# Patient Record
Sex: Female | Born: 1945 | Race: Black or African American | Hispanic: No | Marital: Single | Smoking: Never smoker
Health system: Southern US, Community
[De-identification: ages and names within clinical notes are randomized; demographics above are authoritative.]

## PROBLEM LIST (undated history)

## (undated) DIAGNOSIS — I10 Essential (primary) hypertension: Secondary | ICD-10-CM

## (undated) DIAGNOSIS — F0280 Dementia in other diseases classified elsewhere without behavioral disturbance: Secondary | ICD-10-CM

## (undated) DIAGNOSIS — G309 Alzheimer's disease, unspecified: Secondary | ICD-10-CM

## (undated) DIAGNOSIS — R001 Bradycardia, unspecified: Secondary | ICD-10-CM

## (undated) DIAGNOSIS — E079 Disorder of thyroid, unspecified: Secondary | ICD-10-CM

## (undated) DIAGNOSIS — G43909 Migraine, unspecified, not intractable, without status migrainosus: Secondary | ICD-10-CM

## (undated) HISTORY — PX: BACK SURGERY: SHX140

## (undated) HISTORY — PX: ABDOMINAL HYSTERECTOMY: SHX81

## (undated) HISTORY — PX: OTHER SURGICAL HISTORY: SHX169

## (undated) HISTORY — PX: CARPAL TUNNEL RELEASE: SHX101

---

## 2015-01-25 DIAGNOSIS — I10 Essential (primary) hypertension: Secondary | ICD-10-CM | POA: Diagnosis not present

## 2015-01-25 DIAGNOSIS — G43909 Migraine, unspecified, not intractable, without status migrainosus: Secondary | ICD-10-CM | POA: Diagnosis not present

## 2015-01-25 DIAGNOSIS — M1990 Unspecified osteoarthritis, unspecified site: Secondary | ICD-10-CM | POA: Diagnosis not present

## 2015-01-25 DIAGNOSIS — R739 Hyperglycemia, unspecified: Secondary | ICD-10-CM | POA: Diagnosis not present

## 2015-06-22 DIAGNOSIS — M1990 Unspecified osteoarthritis, unspecified site: Secondary | ICD-10-CM | POA: Diagnosis not present

## 2015-06-22 DIAGNOSIS — G43909 Migraine, unspecified, not intractable, without status migrainosus: Secondary | ICD-10-CM | POA: Diagnosis not present

## 2015-06-22 DIAGNOSIS — Z23 Encounter for immunization: Secondary | ICD-10-CM | POA: Diagnosis not present

## 2015-06-22 DIAGNOSIS — I10 Essential (primary) hypertension: Secondary | ICD-10-CM | POA: Diagnosis not present

## 2015-06-22 DIAGNOSIS — F0390 Unspecified dementia without behavioral disturbance: Secondary | ICD-10-CM | POA: Diagnosis not present

## 2015-06-29 DIAGNOSIS — Z23 Encounter for immunization: Secondary | ICD-10-CM | POA: Diagnosis not present

## 2015-06-29 DIAGNOSIS — M1990 Unspecified osteoarthritis, unspecified site: Secondary | ICD-10-CM | POA: Diagnosis not present

## 2015-06-29 DIAGNOSIS — G43909 Migraine, unspecified, not intractable, without status migrainosus: Secondary | ICD-10-CM | POA: Diagnosis not present

## 2015-06-29 DIAGNOSIS — F0390 Unspecified dementia without behavioral disturbance: Secondary | ICD-10-CM | POA: Diagnosis not present

## 2015-07-19 ENCOUNTER — Other Ambulatory Visit: Payer: Self-pay | Admitting: Neurology

## 2015-07-19 DIAGNOSIS — E559 Vitamin D deficiency, unspecified: Secondary | ICD-10-CM | POA: Diagnosis not present

## 2015-07-19 DIAGNOSIS — M818 Other osteoporosis without current pathological fracture: Secondary | ICD-10-CM | POA: Diagnosis not present

## 2015-07-19 DIAGNOSIS — R519 Headache, unspecified: Secondary | ICD-10-CM

## 2015-07-19 DIAGNOSIS — M545 Low back pain: Secondary | ICD-10-CM | POA: Diagnosis not present

## 2015-07-19 DIAGNOSIS — R5383 Other fatigue: Secondary | ICD-10-CM | POA: Diagnosis not present

## 2015-07-19 DIAGNOSIS — E538 Deficiency of other specified B group vitamins: Secondary | ICD-10-CM | POA: Diagnosis not present

## 2015-07-19 DIAGNOSIS — R51 Headache: Principal | ICD-10-CM

## 2015-07-19 DIAGNOSIS — R1084 Generalized abdominal pain: Secondary | ICD-10-CM | POA: Diagnosis not present

## 2015-07-19 DIAGNOSIS — R410 Disorientation, unspecified: Secondary | ICD-10-CM

## 2015-07-19 DIAGNOSIS — R413 Other amnesia: Secondary | ICD-10-CM | POA: Diagnosis not present

## 2015-07-19 DIAGNOSIS — Z79899 Other long term (current) drug therapy: Secondary | ICD-10-CM | POA: Diagnosis not present

## 2015-07-19 DIAGNOSIS — G4452 New daily persistent headache (NDPH): Secondary | ICD-10-CM | POA: Diagnosis not present

## 2015-08-02 ENCOUNTER — Ambulatory Visit (HOSPITAL_COMMUNITY): Payer: Commercial Managed Care - HMO

## 2015-08-25 ENCOUNTER — Ambulatory Visit (HOSPITAL_COMMUNITY)
Admission: RE | Admit: 2015-08-25 | Discharge: 2015-08-25 | Disposition: A | Discharge status: Home / Self Care | Payer: Commercial Managed Care - HMO | Source: Ambulatory Visit | Attending: Neurology | Admitting: Neurology

## 2015-08-25 DIAGNOSIS — R410 Disorientation, unspecified: Secondary | ICD-10-CM

## 2015-08-25 DIAGNOSIS — R9089 Other abnormal findings on diagnostic imaging of central nervous system: Secondary | ICD-10-CM | POA: Insufficient documentation

## 2015-08-25 DIAGNOSIS — R51 Headache: Secondary | ICD-10-CM | POA: Diagnosis not present

## 2015-08-25 DIAGNOSIS — R519 Headache, unspecified: Secondary | ICD-10-CM

## 2015-08-30 DIAGNOSIS — I10 Essential (primary) hypertension: Secondary | ICD-10-CM | POA: Diagnosis not present

## 2015-08-30 DIAGNOSIS — I776 Arteritis, unspecified: Secondary | ICD-10-CM | POA: Diagnosis not present

## 2015-08-30 DIAGNOSIS — R4182 Altered mental status, unspecified: Secondary | ICD-10-CM | POA: Diagnosis not present

## 2015-08-30 DIAGNOSIS — G4459 Other complicated headache syndrome: Secondary | ICD-10-CM | POA: Diagnosis not present

## 2015-08-30 DIAGNOSIS — G4710 Hypersomnia, unspecified: Secondary | ICD-10-CM | POA: Diagnosis not present

## 2015-08-30 DIAGNOSIS — Z79899 Other long term (current) drug therapy: Secondary | ICD-10-CM | POA: Diagnosis not present

## 2015-09-21 DIAGNOSIS — F0390 Unspecified dementia without behavioral disturbance: Secondary | ICD-10-CM | POA: Diagnosis not present

## 2015-09-21 DIAGNOSIS — M1990 Unspecified osteoarthritis, unspecified site: Secondary | ICD-10-CM | POA: Diagnosis not present

## 2015-09-21 DIAGNOSIS — I10 Essential (primary) hypertension: Secondary | ICD-10-CM | POA: Diagnosis not present

## 2015-09-29 DIAGNOSIS — R569 Unspecified convulsions: Secondary | ICD-10-CM | POA: Diagnosis not present

## 2015-10-11 DIAGNOSIS — I10 Essential (primary) hypertension: Secondary | ICD-10-CM | POA: Diagnosis not present

## 2015-10-11 DIAGNOSIS — G43C0 Periodic headache syndromes in child or adult, not intractable: Secondary | ICD-10-CM | POA: Diagnosis not present

## 2015-10-11 DIAGNOSIS — F0150 Vascular dementia without behavioral disturbance: Secondary | ICD-10-CM | POA: Diagnosis not present

## 2015-10-11 DIAGNOSIS — G309 Alzheimer's disease, unspecified: Secondary | ICD-10-CM | POA: Diagnosis not present

## 2015-12-21 DIAGNOSIS — G43909 Migraine, unspecified, not intractable, without status migrainosus: Secondary | ICD-10-CM | POA: Diagnosis not present

## 2015-12-21 DIAGNOSIS — M170 Bilateral primary osteoarthritis of knee: Secondary | ICD-10-CM | POA: Diagnosis not present

## 2015-12-21 DIAGNOSIS — I10 Essential (primary) hypertension: Secondary | ICD-10-CM | POA: Diagnosis not present

## 2015-12-21 DIAGNOSIS — F0280 Dementia in other diseases classified elsewhere without behavioral disturbance: Secondary | ICD-10-CM | POA: Diagnosis not present

## 2016-01-04 DIAGNOSIS — G309 Alzheimer's disease, unspecified: Secondary | ICD-10-CM | POA: Diagnosis not present

## 2016-01-04 DIAGNOSIS — Z79899 Other long term (current) drug therapy: Secondary | ICD-10-CM | POA: Diagnosis not present

## 2016-01-04 DIAGNOSIS — I10 Essential (primary) hypertension: Secondary | ICD-10-CM | POA: Diagnosis not present

## 2016-01-04 DIAGNOSIS — F0150 Vascular dementia without behavioral disturbance: Secondary | ICD-10-CM | POA: Diagnosis not present

## 2016-03-05 DIAGNOSIS — G309 Alzheimer's disease, unspecified: Secondary | ICD-10-CM | POA: Diagnosis not present

## 2016-03-05 DIAGNOSIS — I10 Essential (primary) hypertension: Secondary | ICD-10-CM | POA: Diagnosis not present

## 2016-03-05 DIAGNOSIS — F0150 Vascular dementia without behavioral disturbance: Secondary | ICD-10-CM | POA: Diagnosis not present

## 2016-03-05 DIAGNOSIS — G43C0 Periodic headache syndromes in child or adult, not intractable: Secondary | ICD-10-CM | POA: Diagnosis not present

## 2016-04-01 ENCOUNTER — Encounter (HOSPITAL_COMMUNITY): Payer: Self-pay | Admitting: *Deleted

## 2016-04-01 ENCOUNTER — Emergency Department (HOSPITAL_COMMUNITY): Payer: Commercial Managed Care - HMO

## 2016-04-01 ENCOUNTER — Emergency Department (HOSPITAL_COMMUNITY)
Admission: EM | Admit: 2016-04-01 | Discharge: 2016-04-03 | Disposition: A | Discharge status: Home / Self Care | Payer: Commercial Managed Care - HMO | Attending: Emergency Medicine | Admitting: Emergency Medicine

## 2016-04-01 DIAGNOSIS — R443 Hallucinations, unspecified: Secondary | ICD-10-CM | POA: Diagnosis not present

## 2016-04-01 DIAGNOSIS — G309 Alzheimer's disease, unspecified: Secondary | ICD-10-CM | POA: Diagnosis not present

## 2016-04-01 DIAGNOSIS — Z79899 Other long term (current) drug therapy: Secondary | ICD-10-CM | POA: Insufficient documentation

## 2016-04-01 DIAGNOSIS — R45851 Suicidal ideations: Secondary | ICD-10-CM | POA: Insufficient documentation

## 2016-04-01 DIAGNOSIS — Z046 Encounter for general psychiatric examination, requested by authority: Secondary | ICD-10-CM | POA: Diagnosis present

## 2016-04-01 DIAGNOSIS — R4182 Altered mental status, unspecified: Secondary | ICD-10-CM | POA: Diagnosis not present

## 2016-04-01 DIAGNOSIS — I10 Essential (primary) hypertension: Secondary | ICD-10-CM | POA: Insufficient documentation

## 2016-04-01 HISTORY — DX: Dementia in other diseases classified elsewhere, unspecified severity, without behavioral disturbance, psychotic disturbance, mood disturbance, and anxiety: F02.80

## 2016-04-01 HISTORY — DX: Alzheimer's disease, unspecified: G30.9

## 2016-04-01 HISTORY — DX: Essential (primary) hypertension: I10

## 2016-04-01 HISTORY — DX: Disorder of thyroid, unspecified: E07.9

## 2016-04-01 HISTORY — DX: Migraine, unspecified, not intractable, without status migrainosus: G43.909

## 2016-04-01 MED ORDER — AMLODIPINE BESY-BENAZEPRIL HCL 5-20 MG PO CAPS: 1.0000 | ORAL_CAPSULE | Freq: Every day | ORAL | Status: DC

## 2016-04-01 MED ORDER — MEMANTINE HCL 10 MG PO TABS
ORAL_TABLET | ORAL | Status: AC
Start: 2016-04-01 — End: 2016-04-01
  Filled 2016-04-01: qty 1

## 2016-04-01 MED ORDER — MEMANTINE HCL 5 MG PO TABS
5.0000 mg | ORAL_TABLET | Freq: Two times a day (BID) | ORAL | Status: DC
  Administered 2016-04-02 – 2016-04-03 (×4): 5 mg via ORAL
  Filled 2016-04-01 (×6): qty 1

## 2016-04-01 MED ORDER — TOPIRAMATE 100 MG PO TABS
100.0000 mg | ORAL_TABLET | Freq: Every day | ORAL | Status: DC
  Administered 2016-04-02 – 2016-04-03 (×2): 100 mg via ORAL
  Filled 2016-04-01 (×3): qty 1

## 2016-04-01 MED ORDER — DONEPEZIL HCL 5 MG PO TABS
10.0000 mg | ORAL_TABLET | Freq: Every day | ORAL | Status: DC
  Administered 2016-04-02 – 2016-04-03 (×2): 10 mg via ORAL
  Filled 2016-04-01 (×2): qty 2
  Filled 2016-04-01: qty 1

## 2016-04-01 NOTE — ED Notes (Addendum)
Pt thinks that "people" are coming at her and looking at her stuff. Daughter states that the people are people in "framed pictures" in her home. Pt stating she has had to place all of her stuff in her pocketbook that "them people keeping looking at." Pt does have alzheimer's. Pt hearing voices. Pt thinking the people in the pictures are talking to her. Pt rambling on and her when asked a question, pt talks about something else totally different.   Per her granddaughter pt came to her house distraught stating "those people are talking me." Daughter states that she went to the house with her and she pointed to "people in the pictures" and they are talking to her. Pt was stating "I want to throw myself in a ditch. I'm not worth anything."   When this nurse asked this pt if she was wanting to harm herself she looked at me and said, "oh no honey! Never!"

## 2016-04-01 NOTE — ED Provider Notes (Addendum)
By signing my name below, I, Doreatha Martinva Mathews, attest that this documentation has been prepared under the direction and in the presence of Kristen N Ward, DO. Electronically Signed: Doreatha MartinEva Mathews, ED Scribe. 04/01/2016. 11:15 PM.   TIME SEEN: 11:07 PM   CHIEF COMPLAINT:  Chief Complaint  Patient presents with  . V70.1     HPI:  HPI Comments: Barbara Ruiz is a 70 y.o. female with h/o Alzheimer's disease who presents to the Emergency Department d/t ongoing SI that worsening tonight. Per daughters, pt has been repeatedly threatening to kill herself and refusing to take her medications. Daugther reports a prior suicide attempt, but denies any violence toward others. Per daughters, the pt has also been exhibiting paranoia and experiencing hallucinations. They state that she is seeing people in her house that are the people that are in the frame and pictures in her home. They state that she thinks that these people are talking to her. Daughters deny fever, cough, vomiting, diarrhea, recent head injury or fall. Pt currently denies any pain. Per daughters, the pt has not recently been prescribed any new medications.   ROS: Level V caveat on for dementia  PAST MEDICAL HISTORY/PAST SURGICAL HISTORY:  Past Medical History  Diagnosis Date  . Alzheimer's dementia   . Hypertension   . Migraine   . Thyroid disease     MEDICATIONS:  Prior to Admission medications   Not on File    ALLERGIES:  No Known Allergies  SOCIAL HISTORY:  Social History  Substance Use Topics  . Smoking status: Never Smoker   . Smokeless tobacco: Not on file  . Alcohol Use: No    FAMILY HISTORY: History reviewed. No pertinent family history.  EXAM: BP 125/69 mmHg  Pulse 42  Temp(Src) 97.9 F (36.6 C) (Oral)  Resp 16  Ht 5\' 3"  (1.6 m)  Wt 170 lb (77.111 kg)  BMI 30.12 kg/m2  SpO2 100% CONSTITUTIONAL: Alert and oriented to person only. Responds appropriately to questions. In NAD. Elderly.  HEAD:  Normocephalic EYES: Conjunctivae clear, PERRL ENT: normal nose; no rhinorrhea; moist mucous membranes NECK: Supple, no meningismus, no LAD  CARD: RRR; S1 and S2 appreciated; no murmurs, no clicks, no rubs, no gallops RESP: Normal chest excursion without splinting or tachypnea; breath sounds clear and equal bilaterally; no wheezes, no rhonchi, no rales, no hypoxia or respiratory distress, speaking full sentences ABD/GI: Normal bowel sounds; non-distended; soft, non-tender, no rebound, no guarding, no peritoneal signs BACK:  The back appears normal and is non-tender to palpation, there is no CVA tenderness EXT: Normal ROM in all joints; non-tender to palpation; no edema; normal capillary refill; no cyanosis, no calf tenderness or swelling    SKIN: Normal color for age and race; warm; no rash NEURO: Moves all extremities equally, sensation to light touch intact diffusely, cranial nerves II through XII intact. No aphasia or dysarthria.  PSYCH: Pt endorsing SI. No HI. Has paranoia and hallucinations. Agitated.   MEDICAL DECISION MAKING: Patient here with suicidal ideation. Repeatedly stating, "I wish I would just die".  Daughters report the patient has had a previous suicide attempt when they were younger. They state she is having hallucinations at home visual and auditory. No current medical complaints. No sign of any trauma. No changes in medications recently. Will check labs, urine and head CT. We'll consult TTS once medically cleared.  ED PROGRESS: 6:00 AM  Patient's labs, urine unremarkable. Head CT shows no acute abnormality. Awaiting TTS evaluation. Patient's daughters are  still at bedside.  Patient,, cooperative, resting comfortably.    Date: 04/02/2016 00:02  Rate: 45  Rhythm: Sinus bradycardia  QRS Axis: First-degree AV block  Intervals: normal  ST/T Wave abnormalities: normal  Conduction Disutrbances: none  Narrative Interpretation: Sinus bradycardia with first-degree AV block, no old  for comparison      I personally performed the services described in this documentation, which was scribed in my presence. The recorded information has been reviewed and is accurate.   Layla Maw Ward, DO 04/02/16 0547  Layla Maw Ward, DO 04/02/16 [phone]

## 2016-04-02 ENCOUNTER — Emergency Department (HOSPITAL_COMMUNITY): Payer: Commercial Managed Care - HMO

## 2016-04-02 DIAGNOSIS — R4182 Altered mental status, unspecified: Secondary | ICD-10-CM | POA: Diagnosis not present

## 2016-04-02 LAB — RAPID URINE DRUG SCREEN, HOSP PERFORMED
Amphetamines: NOT DETECTED
BARBITURATES: NOT DETECTED
Benzodiazepines: NOT DETECTED
COCAINE: NOT DETECTED
Opiates: NOT DETECTED
Tetrahydrocannabinol: NOT DETECTED

## 2016-04-02 LAB — CBC WITH DIFFERENTIAL/PLATELET
BASOS PCT: 0 %
Basophils Absolute: 0 10*3/uL (ref 0.0–0.1)
Eosinophils Absolute: 0 10*3/uL (ref 0.0–0.7)
Eosinophils Relative: 0 %
HEMATOCRIT: 37.8 % (ref 36.0–46.0)
Hemoglobin: 12.3 g/dL (ref 12.0–15.0)
Lymphocytes Relative: 21 %
Lymphs Abs: 1.1 10*3/uL (ref 0.7–4.0)
MCH: 30.1 pg (ref 26.0–34.0)
MCHC: 32.5 g/dL (ref 30.0–36.0)
MCV: 92.4 fL (ref 78.0–100.0)
MONO ABS: 0.3 10*3/uL (ref 0.1–1.0)
MONOS PCT: 7 %
NEUTROS ABS: 3.8 10*3/uL (ref 1.7–7.7)
Neutrophils Relative %: 72 %
Platelets: 216 10*3/uL (ref 150–400)
RBC: 4.09 MIL/uL (ref 3.87–5.11)
RDW: 14.6 % (ref 11.5–15.5)
WBC: 5.3 10*3/uL (ref 4.0–10.5)

## 2016-04-02 LAB — URINALYSIS, ROUTINE W REFLEX MICROSCOPIC
Bilirubin Urine: NEGATIVE
Glucose, UA: NEGATIVE mg/dL
Hgb urine dipstick: NEGATIVE
Ketones, ur: NEGATIVE mg/dL
LEUKOCYTES UA: NEGATIVE
Nitrite: NEGATIVE
PROTEIN: NEGATIVE mg/dL
Specific Gravity, Urine: 1.015 (ref 1.005–1.030)
pH: 7 (ref 5.0–8.0)

## 2016-04-02 LAB — COMPREHENSIVE METABOLIC PANEL
ALBUMIN: 3.7 g/dL (ref 3.5–5.0)
ALK PHOS: 40 U/L (ref 38–126)
ALT: 11 U/L — AB (ref 14–54)
AST: 15 U/L (ref 15–41)
Anion gap: 4 — ABNORMAL LOW (ref 5–15)
BILIRUBIN TOTAL: 0.4 mg/dL (ref 0.3–1.2)
BUN: 21 mg/dL — AB (ref 6–20)
CALCIUM: 8.7 mg/dL — AB (ref 8.9–10.3)
CO2: 26 mmol/L (ref 22–32)
CREATININE: 0.91 mg/dL (ref 0.44–1.00)
Chloride: 111 mmol/L (ref 101–111)
GFR calc Af Amer: >60 mL/min (ref >60)
GFR calc non Af Amer: >60 mL/min (ref >60)
GLUCOSE: 105 mg/dL — AB (ref 65–99)
POTASSIUM: 4 mmol/L (ref 3.5–5.1)
Sodium: 141 mmol/L (ref 135–145)
TOTAL PROTEIN: 6.1 g/dL — AB (ref 6.5–8.1)

## 2016-04-02 LAB — ETHANOL: Alcohol, Ethyl (B): <5 mg/dL (ref <5)

## 2016-04-02 MED ORDER — BENAZEPRIL HCL 10 MG PO TABS
20.0000 mg | ORAL_TABLET | Freq: Every day | ORAL | Status: DC
  Administered 2016-04-02 – 2016-04-03 (×2): 20 mg via ORAL
  Filled 2016-04-02 (×2): qty 2
  Filled 2016-04-02: qty 1

## 2016-04-02 MED ORDER — AMLODIPINE BESYLATE 5 MG PO TABS
5.0000 mg | ORAL_TABLET | Freq: Every day | ORAL | Status: DC
  Administered 2016-04-02 – 2016-04-03 (×2): 5 mg via ORAL
  Filled 2016-04-02 (×2): qty 1

## 2016-04-02 NOTE — ED Notes (Signed)
Alphia MohJuene Martin daughter [phone] Maryln ManuelMichele David [phone]

## 2016-04-02 NOTE — ED Notes (Signed)
Pt refusing to allow staff to obtain EKG. Pt becoming angry, stating it has already been done. Arguing with staff. Called daughter Marcelino DusterMichelle and she will come in to speak with mother

## 2016-04-02 NOTE — BH Assessment (Addendum)
Tele Assessment Note   Barbara Ruiz is an 70 y.o. female. Pt presents voluntarily to [facility] daughters. Pt is pleasant and cooperative. She knows her name but not her birth date. Otherwise, pt is not oriented at all. Pt is poor historian. Her affect is euthymic and somewhat confused. She denies SI and HI. When asked re: people living in her picture frames, pt points to empty bedside chairs and says her family will be back soon.  Collateral info provided via telephone by daughter Alphia Moh [phone]. She reports pt dx with Alzheimer's. She says pt lives in senior section of [employer]. She says her sisters live across the st from pt. Daphine Deutscher reports she and sisters cook for pt, but pt lives alone. Daphine Deutscher reports pt able to complete all ADLs. She says pt had two suicide attempts by overdose when daughters were young. Daphine Deutscher reports pt has been inpatient at psychiatric facility in [place] around same time as suicide attempts. She says pt's neurologist in [place], and pt is refusing to take her meds prescribed by him. Daphine Deutscher says sometimes pt will sleep on couch b/c she doesn't want the "people in the picture frame" to be lonely. She says pt thinks pictures are speaking to her and that the people in the pictures are alive. She says pt has been threatening to kill herself several times lately.   Diagnosis:  Dementia, Alzheimer's type MDD, by history  Past Medical History:  Past Medical History  Diagnosis Date  . Alzheimer's dementia   . Hypertension   . Migraine   . Thyroid disease     Past Surgical History  Procedure Laterality Date  . Abdominal hysterectomy    . Thyroid removed    . Back surgery    . Carpal tunnel release      Family History: History reviewed. No pertinent family history.  Social History:  reports that she has never smoked. She does not have any smokeless tobacco history on file. She reports that she does not drink alcohol or use illicit  drugs.  Additional Social History:  Alcohol / Drug Use Pain Medications: no hx of abuse - see pta meds list Prescriptions: no hx of abuse - see pta meds list Over the Counter: no hx of abuse - see pta meds list History of alcohol / drug use?: No history of alcohol / drug abuse Longest period of sobriety (when/how long): n/a  CIWA: CIWA-Ar BP: 125/69 mmHg Pulse Rate: (!) 42 COWS:    PATIENT STRENGTHS: (choose at least two) Active sense of humor Physical Health Supportive family/friends  Allergies: No Known Allergies  Home Medications:  (Not in a hospital admission)  OB/GYN Status:  No LMP recorded. Patient has had a hysterectomy.  General Assessment Data Location of Assessment: [facility] TTS Assessment: In system Is this a Tele or Face-to-Face Assessment?: Tele Assessment Is this an Initial Assessment or a Re-assessment for this encounter?: Initial Assessment Marital status: Divorced Juanell Fairly name: Christell Constant Is patient pregnant?: No Pregnancy Status: No Living Arrangements: Alone Can pt return to current living arrangement?: Yes Admission Status: Voluntary Is patient capable of signing voluntary admission?: Yes Referral Source: Self/Family/Friend Insurance type: Database administrator     Crisis Care Plan Living Arrangements: Alone Name of Psychiatrist: n/a Name of Therapist: n/a  Education Status Is patient currently in school?: No Highest grade of school patient has completed: 12  Risk to self with the past 6 months Suicidal Ideation: No Has patient been  a risk to self within the past 6 months prior to admission? : No Suicidal Intent: No Has patient had any suicidal intent within the past 6 months prior to admission? : No Is patient at risk for suicide?: Yes Suicidal Plan?: No Has patient had any suicidal plan within the past 6 months prior to admission? : No Access to Means:  (n/a) What has been your use of drugs/alcohol within the last 12 months?: none Previous  Attempts/Gestures: Yes How many times?: 2 (when pt's children were young, by overdose) Other Self Harm Risks: none Triggers for Past Attempts: Unknown Intentional Self Injurious Behavior: None Family Suicide History: No Recent stressful life event(s):  (unable to assess) Persecutory voices/beliefs?: No Depression: No Depression Symptoms:  (n/a) Substance abuse history and/or treatment for substance abuse?: No Suicide prevention information given to non-admitted patients: Not applicable  Risk to Others within the past 6 months Homicidal Ideation: No Does patient have any lifetime risk of violence toward others beyond the six months prior to admission? : No Thoughts of Harm to Others: No Current Homicidal Intent: No Current Homicidal Plan: No Access to Homicidal Means: No Identified Victim: none History of harm to others?: No Assessment of Violence: None Noted Does patient have access to weapons?: No Does patient have a court date: No Is patient on probation?: No  Psychosis Hallucinations: None noted (pt denies, but daughter reports pt has AH & VH) Delusions:  (daughter sts pt believes people in pictures are alive)  Mental Status Report Appearance/Hygiene: Unremarkable, In scrubs Eye Contact: Good Motor Activity: Freedom of movement, Restlessness Speech: Logical/coherent Level of Consciousness: Alert Mood:  (unable to assess) Affect: Other (Comment) (euthymic, confused) Anxiety Level: None Thought Processes: Irrelevant Judgement: Impaired Orientation: Person (pt knows her name but not birthdate) Obsessive Compulsive Thoughts/Behaviors: Unable to Assess  Cognitive Functioning Concentration: Decreased Memory: Remote Impaired, Recent Impaired IQ: Average Insight: Poor Impulse Control: Poor Appetite: Good Sleep: No Change Vegetative Symptoms: None  ADLScreening [facility] Assessment Services) Patient's cognitive ability adequate to safely complete daily activities?:  No Patient able to express need for assistance with ADLs?: Yes Independently performs ADLs?: Yes (appropriate for developmental age)  Prior Inpatient Therapy Prior Inpatient Therapy: Yes Prior Therapy Dates: when her kids were young Prior Therapy Facilty/Provider(s): [place] Reason for Treatment: depression, suicide attempts  Prior Outpatient Therapy Prior Outpatient Therapy: Yes Prior Therapy Dates: in the distant past Reason for Treatment: depression Does patient have an ACCT team?: No Does patient have Intensive In-House Services?  : No Does patient have [facility] services? : No Does patient have P4CC services?: No  ADL Screening (condition at time of admission) Patient's cognitive ability adequate to safely complete daily activities?: No Is the patient deaf or have difficulty hearing?: No Does the patient have difficulty seeing, even when wearing glasses/contacts?: No Does the patient have difficulty concentrating, remembering, or making decisions?: Yes Patient able to express need for assistance with ADLs?: Yes Does the patient have difficulty dressing or bathing?: No Independently performs ADLs?: Yes (appropriate for developmental age) Communication: Independent Dressing (OT): Independent Grooming: Independent Feeding: Independent (daughter cooks for her but she can eat independently) Bathing: Independent Toileting: Independent In/Out Bed: Independent Walks in Home: Independent Does the patient have difficulty walking or climbing stairs?: No Weakness of Legs: None Weakness of Arms/Hands: None  Home Assistive Devices/Equipment Home Assistive Devices/Equipment: Eyeglasses, Grab bars in shower, Grab bars around toilet    Abuse/Neglect Assessment (Assessment to be complete while patient is alone) Physical Abuse:  (  unable to assess) Verbal Abuse:  (unable to assess) Sexual Abuse:  (unable to assess) Exploitation of patient/patient's resources:  (unable to  assess) Self-Neglect:  (unable to assess)     Advance Directives (For Healthcare) Does patient have an advance directive?: No Would patient like information on creating an advanced directive?: No - patient declined information    Additional Information 1:1 In Past 12 Months?: No CIRT Risk: No Elopement Risk: No Does patient have medical clearance?: Yes     Disposition:  Disposition Initial Assessment Completed for this Encounter: Yes Disposition of Patient: Inpatient treatment program Type of inpatient treatment program: Adult (laura davis NP recommends [facility])  Phoenyx Melka P 04/02/2016 8:34 AM

## 2016-04-02 NOTE — Progress Notes (Signed)
This Clinical research associatewriter faxed updated EKG and vitals to [facility] for review.      Maryelizabeth Rowanressa Homer Miller, MSW, Clare CharonLCSW, LCAS [facility] T[facility] Specialist [phone]

## 2016-04-02 NOTE — Progress Notes (Addendum)
Inpatient psychiatric treatment recommended for pt per TTS assessment.  Referring to gero-psychiatric units/facilities due to medical hx of dementia/Alzheimer's.  Referral made to: [facility]- per Delorise ShinerGrace, referral will need to include EKG and chest x-ray. Informed ED, faxed referral and will send EKG/chest xray results when completed. [facility] -per Jamesetta SoPhyllis, one bed open today, referral sent [facility]- per Charmaine [place], [facility] campus now accomodates pt's with dementia/Alzheimer's. Referral sent [facility])- per Caryn BeeKevin, one geriatric bed opening today, [facility] at [facility]. [facility] with [facility] and will refer if there is availability.  Ilean SkillMeghan Eva Vallee, MSW, LCSW Clinical Social Work, Disposition  04/02/2016 [phone]  11:40: EKG and chest xray results sent to accompany referral to [facility].

## 2016-04-02 NOTE — ED Notes (Signed)
[facility] [place]. Family and pt aware. Family in to visit, aware of what pt can take with her to hospital. Juel BurrowPelham called will be here approx 3:30 pm for transport

## 2016-04-02 NOTE — Progress Notes (Signed)
Received message asking to call pt's "mother Astrid DivineMichelle David [phone]." Attempted calling x2- left voicemail.

## 2016-04-02 NOTE — ED Notes (Signed)
[facility], but need repeat EKG and Vitals due to HR 50 on last check

## 2016-04-02 NOTE — ED Provider Notes (Signed)
Vitals stable.  Labs and imaging tests reviewed. No acute issues.  Plan is for transfer to psychiatric facility.   Linwood DibblesJon Kenedee Molesky, MD 04/02/16 1414

## 2016-04-02 NOTE — ED Notes (Signed)
TTS in progress 

## 2016-04-02 NOTE — Progress Notes (Signed)
[facility] accepted [facility] per John in admissions. Address: [address], [place], [place] [id number] Accepting MD: Dr. Nada LibmanSaka Salami Number for report: [phone]  Called to inform pt's daughter (listed as emergency contact) Gwenlyn FudgeLajuene Martin [phone]. She is aware of pending transfer. Asks that she be able to bring pt belongings to take with her. States she is calling sister to coordinate who will be coming to ED and will call when expected time is identified in order to proceed with transfer.  Daughter asked visiting hours for [facility]- provided her with facility's contact number. Informed ED.  Ilean SkillMeghan Brayen Bunn, MSW, LCSW Clinical Social Work, Disposition  04/02/2016 [phone]

## 2016-04-02 NOTE — Progress Notes (Addendum)
[facility] at [facility] called to state that the facility's scheduled d/c unexpectedly did not occur. States that pt does not have bed today but is on waiting list for next available bed. Informed ED. Pt's family in ED to be informed.  Will continue following up on other referrals as well.  Ilean SkillMeghan Breeana Sawtelle, MSW, LCSW Clinical Social Work, Disposition  04/02/2016 [phone]  Delorise [facility]inerGrace at [facility] states referral is still being reviewed- had stated that pt could not be considered due to HR <60. Faxed most recent vitals indicating pt's most recent pulse was 78.

## 2016-04-02 NOTE — ED Notes (Signed)
Dr Rubin PayorPickering reviewed EKG

## 2016-04-02 NOTE — Progress Notes (Signed)
This Clinical research associatewriter received a call from [facility] intake nurse at [facility] requesting a repeat EKG and vitals because of concerns with pulse being 42.    This Clinical research associatewriter spoke with Zella Ballobin, RN to inform her of the request from [facility] and agreed to complete this task.      Maryelizabeth Rowanressa Lars Jeziorski, MSW, Clare CharonLCSW, LCAS [facility] Triage Specialist [phone]

## 2016-04-02 NOTE — ED Notes (Signed)
[facility] called. Pt cannot come at this time due to bed not being ready at this time. Family aware. Transportation cancelled

## 2016-04-03 NOTE — ED Notes (Signed)
Asked pt is she was si/hi. Pt stated "lord no honey, i have never killed myself"

## 2016-04-03 NOTE — Progress Notes (Signed)
Updated [facility] on most recent vitals. They remain concerned about HR in 40s. Called pt's daughter Gwenlyn FudgeLajuene Martin [phone]- she states that she attends medical appointments with patients and "has never been told anything about her heart rate being an issue." Reports no known hx of cardiac issues. ED inquired of pt also, who reported no known issues with HR. Informed [facility] of above. Admitting MD to review information and call back.  Ilean SkillMeghan Kyndell Zeiser, MSW, LCSW Clinical Social Work, Disposition  04/03/2016 [phone]

## 2016-04-03 NOTE — ED Provider Notes (Signed)
[facility]  Pt has mild bradycardia, patient is asymptomatic, patient is not on B blockers. Plan for IVC for acceptance in transfer.  Blane OharaZAVITZ, [name] Enid SkeensM   [name], MD 04/03/16 [phone]

## 2016-04-03 NOTE — Progress Notes (Signed)
Barbara Ruiz, intake RN at [facility], states that admitting MD has stated that pt can be accepted for admission pending receiving documentation by MD that pt is asymptomatic re: HR and IVC paperwork. (Pt does not have a healthcare power of attorney per daughter, therefore Barbara Ruiz states Dr. Yetta BarreJones requires IVC- states that pt dx with alzheimer's as well as endorsing SI indicates it would be unsafe to require pt to consent to treatment voluntarily without knowing if she has capacity to understand significance of treatment/safety issues) Informed ED of [facility]'s requests. When documents available, will fax to [facility], at which time Barbara Ruiz states acceptance can be offered. Updated pt's daughter Gwenlyn FudgeLajuene Martin [phone].  Ilean SkillMeghan Abou Sterkel, MSW, LCSW Clinical Social Work, Disposition  04/03/2016 [phone]

## 2016-04-03 NOTE — Progress Notes (Signed)
[facility] per Dr. Yetta BarreJones and call report to [phone].     Maryelizabeth Rowanressa Kainalu Heggs, MSW, Clare CharonLCSW, LCAS [facility] Triage Specialist [phone]

## 2016-04-03 NOTE — ED Notes (Signed)
[facility] at [phone] this am.  Checked with Dagoberto Reeflerk who says faxed were sent out much, much earlier.  Called [facility] to check on the Status of Papers being served to the Pt(awaiting transfer to [facility]).

## 2016-04-03 NOTE — ED Notes (Signed)
Called [facility] again to check on the Status of IVC Papers being served on Pt.

## 2016-04-03 NOTE — Progress Notes (Signed)
Received call from pt's daughter Gwenlyn FudgeLajuene Martin [phone]. Updated her on pt awaiting transfer [facility]. Daughter had questions re: nature of inpatient treatment, general questions CSW answered and for specific treatment questions, directed daughter to follow up with treatment team once [facility]. Daughter thanked staff for care of pt.  Ilean SkillMeghan Dhwani Venkatesh, MSW, LCSW Clinical Social Work, Disposition  04/03/2016 [phone]

## 2016-04-03 NOTE — ED Notes (Signed)
Gave [facility] to [facility], patient vital signs stable, patient at bedside, no distress noted, being [facility].

## 2016-04-03 NOTE — ED Notes (Signed)
Pt calm, cooperative morning meds with no problems. Pt using word salad, multiple topics, confused to place, and time

## 2016-04-03 NOTE — ED Provider Notes (Signed)
  Physical Exam  BP 133/57 mmHg  Pulse 45  Temp(Src) 98.1 F (36.7 C) (Oral)  Resp 16  Ht 5\' 3"  (1.6 m)  Wt 170 lb (77.111 kg)  BMI 30.12 kg/m2  SpO2 100%  Physical Exam  ED Course  Procedures  MDM EKG repeated. Showed similar to before. Unable to get in computer at this time.      Benjiman CoreNathan Kathlen Sakurai, MD 04/03/16 [phone]

## 2016-04-04 DIAGNOSIS — R001 Bradycardia, unspecified: Secondary | ICD-10-CM | POA: Diagnosis not present

## 2016-04-04 DIAGNOSIS — G43909 Migraine, unspecified, not intractable, without status migrainosus: Secondary | ICD-10-CM | POA: Diagnosis not present

## 2016-04-04 DIAGNOSIS — N179 Acute kidney failure, unspecified: Secondary | ICD-10-CM | POA: Diagnosis not present

## 2016-04-04 DIAGNOSIS — K5901 Slow transit constipation: Secondary | ICD-10-CM | POA: Diagnosis not present

## 2016-04-04 DIAGNOSIS — F0280 Dementia in other diseases classified elsewhere without behavioral disturbance: Secondary | ICD-10-CM | POA: Diagnosis not present

## 2016-04-04 DIAGNOSIS — H9201 Otalgia, right ear: Secondary | ICD-10-CM | POA: Diagnosis not present

## 2016-04-04 DIAGNOSIS — E048 Other specified nontoxic goiter: Secondary | ICD-10-CM | POA: Diagnosis not present

## 2016-04-04 DIAGNOSIS — H60331 Swimmer's ear, right ear: Secondary | ICD-10-CM | POA: Diagnosis not present

## 2016-04-04 DIAGNOSIS — E049 Nontoxic goiter, unspecified: Secondary | ICD-10-CM | POA: Diagnosis not present

## 2016-04-04 DIAGNOSIS — N289 Disorder of kidney and ureter, unspecified: Secondary | ICD-10-CM | POA: Diagnosis not present

## 2016-04-04 DIAGNOSIS — E785 Hyperlipidemia, unspecified: Secondary | ICD-10-CM | POA: Diagnosis not present

## 2016-04-04 DIAGNOSIS — F0391 Unspecified dementia with behavioral disturbance: Secondary | ICD-10-CM | POA: Diagnosis not present

## 2016-04-04 DIAGNOSIS — I10 Essential (primary) hypertension: Secondary | ICD-10-CM | POA: Diagnosis not present

## 2016-04-04 DIAGNOSIS — G309 Alzheimer's disease, unspecified: Secondary | ICD-10-CM | POA: Diagnosis not present

## 2016-04-04 DIAGNOSIS — H6091 Unspecified otitis externa, right ear: Secondary | ICD-10-CM | POA: Diagnosis not present

## 2016-04-05 DIAGNOSIS — I10 Essential (primary) hypertension: Secondary | ICD-10-CM | POA: Diagnosis not present

## 2016-04-05 DIAGNOSIS — E785 Hyperlipidemia, unspecified: Secondary | ICD-10-CM | POA: Diagnosis not present

## 2016-04-05 DIAGNOSIS — H60331 Swimmer's ear, right ear: Secondary | ICD-10-CM | POA: Diagnosis not present

## 2016-04-05 DIAGNOSIS — H9201 Otalgia, right ear: Secondary | ICD-10-CM | POA: Diagnosis not present

## 2016-04-05 DIAGNOSIS — N289 Disorder of kidney and ureter, unspecified: Secondary | ICD-10-CM | POA: Diagnosis not present

## 2016-04-05 DIAGNOSIS — F0391 Unspecified dementia with behavioral disturbance: Secondary | ICD-10-CM | POA: Diagnosis not present

## 2016-04-05 DIAGNOSIS — R001 Bradycardia, unspecified: Secondary | ICD-10-CM | POA: Diagnosis not present

## 2016-04-06 DIAGNOSIS — F0391 Unspecified dementia with behavioral disturbance: Secondary | ICD-10-CM | POA: Diagnosis not present

## 2016-04-07 DIAGNOSIS — F0391 Unspecified dementia with behavioral disturbance: Secondary | ICD-10-CM | POA: Diagnosis not present

## 2016-04-07 DIAGNOSIS — N289 Disorder of kidney and ureter, unspecified: Secondary | ICD-10-CM | POA: Diagnosis not present

## 2016-04-07 DIAGNOSIS — H60331 Swimmer's ear, right ear: Secondary | ICD-10-CM | POA: Diagnosis not present

## 2016-04-07 DIAGNOSIS — R001 Bradycardia, unspecified: Secondary | ICD-10-CM | POA: Diagnosis not present

## 2016-04-07 DIAGNOSIS — I10 Essential (primary) hypertension: Secondary | ICD-10-CM | POA: Diagnosis not present

## 2016-04-08 DIAGNOSIS — F0391 Unspecified dementia with behavioral disturbance: Secondary | ICD-10-CM | POA: Diagnosis not present

## 2016-04-09 DIAGNOSIS — N289 Disorder of kidney and ureter, unspecified: Secondary | ICD-10-CM | POA: Diagnosis not present

## 2016-04-09 DIAGNOSIS — I10 Essential (primary) hypertension: Secondary | ICD-10-CM | POA: Diagnosis not present

## 2016-04-09 DIAGNOSIS — H60331 Swimmer's ear, right ear: Secondary | ICD-10-CM | POA: Diagnosis not present

## 2016-04-09 DIAGNOSIS — R001 Bradycardia, unspecified: Secondary | ICD-10-CM | POA: Diagnosis not present

## 2016-04-09 DIAGNOSIS — F0391 Unspecified dementia with behavioral disturbance: Secondary | ICD-10-CM | POA: Diagnosis not present

## 2016-04-10 DIAGNOSIS — F0391 Unspecified dementia with behavioral disturbance: Secondary | ICD-10-CM | POA: Diagnosis not present

## 2016-04-11 DIAGNOSIS — K5901 Slow transit constipation: Secondary | ICD-10-CM | POA: Diagnosis not present

## 2016-04-11 DIAGNOSIS — E049 Nontoxic goiter, unspecified: Secondary | ICD-10-CM | POA: Diagnosis not present

## 2016-04-11 DIAGNOSIS — I10 Essential (primary) hypertension: Secondary | ICD-10-CM | POA: Diagnosis not present

## 2016-04-11 DIAGNOSIS — R001 Bradycardia, unspecified: Secondary | ICD-10-CM | POA: Diagnosis not present

## 2016-04-11 DIAGNOSIS — N289 Disorder of kidney and ureter, unspecified: Secondary | ICD-10-CM | POA: Diagnosis not present

## 2016-04-11 DIAGNOSIS — H60331 Swimmer's ear, right ear: Secondary | ICD-10-CM | POA: Diagnosis not present

## 2016-04-11 DIAGNOSIS — F0391 Unspecified dementia with behavioral disturbance: Secondary | ICD-10-CM | POA: Diagnosis not present

## 2016-04-12 DIAGNOSIS — F0391 Unspecified dementia with behavioral disturbance: Secondary | ICD-10-CM | POA: Diagnosis not present

## 2016-04-13 DIAGNOSIS — G43909 Migraine, unspecified, not intractable, without status migrainosus: Secondary | ICD-10-CM | POA: Diagnosis not present

## 2016-04-13 DIAGNOSIS — E048 Other specified nontoxic goiter: Secondary | ICD-10-CM | POA: Diagnosis not present

## 2016-04-13 DIAGNOSIS — F0280 Dementia in other diseases classified elsewhere without behavioral disturbance: Secondary | ICD-10-CM | POA: Diagnosis not present

## 2016-04-13 DIAGNOSIS — H9201 Otalgia, right ear: Secondary | ICD-10-CM | POA: Diagnosis not present

## 2016-04-13 DIAGNOSIS — F0391 Unspecified dementia with behavioral disturbance: Secondary | ICD-10-CM | POA: Diagnosis not present

## 2016-04-13 DIAGNOSIS — E049 Nontoxic goiter, unspecified: Secondary | ICD-10-CM | POA: Diagnosis not present

## 2016-04-13 DIAGNOSIS — N179 Acute kidney failure, unspecified: Secondary | ICD-10-CM | POA: Diagnosis not present

## 2016-04-13 DIAGNOSIS — N289 Disorder of kidney and ureter, unspecified: Secondary | ICD-10-CM | POA: Diagnosis not present

## 2016-04-13 DIAGNOSIS — I10 Essential (primary) hypertension: Secondary | ICD-10-CM | POA: Diagnosis not present

## 2016-04-13 DIAGNOSIS — H6091 Unspecified otitis externa, right ear: Secondary | ICD-10-CM | POA: Diagnosis not present

## 2016-04-13 DIAGNOSIS — R001 Bradycardia, unspecified: Secondary | ICD-10-CM | POA: Diagnosis not present

## 2016-04-13 DIAGNOSIS — H60331 Swimmer's ear, right ear: Secondary | ICD-10-CM | POA: Diagnosis not present

## 2016-04-13 DIAGNOSIS — K5901 Slow transit constipation: Secondary | ICD-10-CM | POA: Diagnosis not present

## 2016-04-13 DIAGNOSIS — G309 Alzheimer's disease, unspecified: Secondary | ICD-10-CM | POA: Diagnosis not present

## 2016-04-19 DIAGNOSIS — I10 Essential (primary) hypertension: Secondary | ICD-10-CM | POA: Diagnosis not present

## 2016-04-19 DIAGNOSIS — F331 Major depressive disorder, recurrent, moderate: Secondary | ICD-10-CM | POA: Diagnosis not present

## 2016-04-19 DIAGNOSIS — G43909 Migraine, unspecified, not intractable, without status migrainosus: Secondary | ICD-10-CM | POA: Diagnosis not present

## 2016-04-19 DIAGNOSIS — G301 Alzheimer's disease with late onset: Secondary | ICD-10-CM | POA: Diagnosis not present

## 2016-05-17 DIAGNOSIS — I10 Essential (primary) hypertension: Secondary | ICD-10-CM | POA: Diagnosis not present

## 2016-05-17 DIAGNOSIS — G43909 Migraine, unspecified, not intractable, without status migrainosus: Secondary | ICD-10-CM | POA: Diagnosis not present

## 2016-05-17 DIAGNOSIS — F331 Major depressive disorder, recurrent, moderate: Secondary | ICD-10-CM | POA: Diagnosis not present

## 2016-05-17 DIAGNOSIS — G300 Alzheimer's disease with early onset: Secondary | ICD-10-CM | POA: Diagnosis not present

## 2016-05-28 DIAGNOSIS — E785 Hyperlipidemia, unspecified: Secondary | ICD-10-CM | POA: Diagnosis not present

## 2016-05-29 DIAGNOSIS — E785 Hyperlipidemia, unspecified: Secondary | ICD-10-CM | POA: Diagnosis not present

## 2016-05-30 DIAGNOSIS — E785 Hyperlipidemia, unspecified: Secondary | ICD-10-CM | POA: Diagnosis not present

## 2016-05-31 DIAGNOSIS — E785 Hyperlipidemia, unspecified: Secondary | ICD-10-CM | POA: Diagnosis not present

## 2016-06-01 DIAGNOSIS — E785 Hyperlipidemia, unspecified: Secondary | ICD-10-CM | POA: Diagnosis not present

## 2016-06-02 DIAGNOSIS — E785 Hyperlipidemia, unspecified: Secondary | ICD-10-CM | POA: Diagnosis not present

## 2016-06-03 DIAGNOSIS — E785 Hyperlipidemia, unspecified: Secondary | ICD-10-CM | POA: Diagnosis not present

## 2016-06-04 DIAGNOSIS — E785 Hyperlipidemia, unspecified: Secondary | ICD-10-CM | POA: Diagnosis not present

## 2016-06-05 DIAGNOSIS — E785 Hyperlipidemia, unspecified: Secondary | ICD-10-CM | POA: Diagnosis not present

## 2016-06-06 DIAGNOSIS — E785 Hyperlipidemia, unspecified: Secondary | ICD-10-CM | POA: Diagnosis not present

## 2016-06-07 DIAGNOSIS — E785 Hyperlipidemia, unspecified: Secondary | ICD-10-CM | POA: Diagnosis not present

## 2016-06-08 DIAGNOSIS — E785 Hyperlipidemia, unspecified: Secondary | ICD-10-CM | POA: Diagnosis not present

## 2016-06-09 DIAGNOSIS — M25572 Pain in left ankle and joints of left foot: Secondary | ICD-10-CM | POA: Diagnosis not present

## 2016-06-09 DIAGNOSIS — R52 Pain, unspecified: Secondary | ICD-10-CM | POA: Diagnosis not present

## 2016-06-10 DIAGNOSIS — E785 Hyperlipidemia, unspecified: Secondary | ICD-10-CM | POA: Diagnosis not present

## 2016-06-11 DIAGNOSIS — E785 Hyperlipidemia, unspecified: Secondary | ICD-10-CM | POA: Diagnosis not present

## 2016-06-12 DIAGNOSIS — E785 Hyperlipidemia, unspecified: Secondary | ICD-10-CM | POA: Diagnosis not present

## 2016-06-13 DIAGNOSIS — E785 Hyperlipidemia, unspecified: Secondary | ICD-10-CM | POA: Diagnosis not present

## 2016-06-14 DIAGNOSIS — G301 Alzheimer's disease with late onset: Secondary | ICD-10-CM | POA: Diagnosis not present

## 2016-06-14 DIAGNOSIS — I10 Essential (primary) hypertension: Secondary | ICD-10-CM | POA: Diagnosis not present

## 2016-06-14 DIAGNOSIS — G43909 Migraine, unspecified, not intractable, without status migrainosus: Secondary | ICD-10-CM | POA: Diagnosis not present

## 2016-06-14 DIAGNOSIS — F331 Major depressive disorder, recurrent, moderate: Secondary | ICD-10-CM | POA: Diagnosis not present

## 2016-06-14 DIAGNOSIS — E785 Hyperlipidemia, unspecified: Secondary | ICD-10-CM | POA: Diagnosis not present

## 2016-06-15 DIAGNOSIS — E785 Hyperlipidemia, unspecified: Secondary | ICD-10-CM | POA: Diagnosis not present

## 2016-06-16 DIAGNOSIS — E785 Hyperlipidemia, unspecified: Secondary | ICD-10-CM | POA: Diagnosis not present

## 2016-06-17 DIAGNOSIS — E785 Hyperlipidemia, unspecified: Secondary | ICD-10-CM | POA: Diagnosis not present

## 2016-06-18 DIAGNOSIS — E785 Hyperlipidemia, unspecified: Secondary | ICD-10-CM | POA: Diagnosis not present

## 2016-06-19 DIAGNOSIS — E785 Hyperlipidemia, unspecified: Secondary | ICD-10-CM | POA: Diagnosis not present

## 2016-06-20 DIAGNOSIS — E785 Hyperlipidemia, unspecified: Secondary | ICD-10-CM | POA: Diagnosis not present

## 2016-06-21 DIAGNOSIS — E785 Hyperlipidemia, unspecified: Secondary | ICD-10-CM | POA: Diagnosis not present

## 2016-06-22 DIAGNOSIS — E785 Hyperlipidemia, unspecified: Secondary | ICD-10-CM | POA: Diagnosis not present

## 2016-06-23 DIAGNOSIS — E785 Hyperlipidemia, unspecified: Secondary | ICD-10-CM | POA: Diagnosis not present

## 2016-06-24 DIAGNOSIS — E785 Hyperlipidemia, unspecified: Secondary | ICD-10-CM | POA: Diagnosis not present

## 2016-06-25 DIAGNOSIS — E785 Hyperlipidemia, unspecified: Secondary | ICD-10-CM | POA: Diagnosis not present

## 2016-06-26 DIAGNOSIS — E785 Hyperlipidemia, unspecified: Secondary | ICD-10-CM | POA: Diagnosis not present

## 2016-06-27 DIAGNOSIS — E785 Hyperlipidemia, unspecified: Secondary | ICD-10-CM | POA: Diagnosis not present

## 2016-06-28 DIAGNOSIS — E785 Hyperlipidemia, unspecified: Secondary | ICD-10-CM | POA: Diagnosis not present

## 2016-06-29 DIAGNOSIS — E785 Hyperlipidemia, unspecified: Secondary | ICD-10-CM | POA: Diagnosis not present

## 2016-06-30 DIAGNOSIS — E785 Hyperlipidemia, unspecified: Secondary | ICD-10-CM | POA: Diagnosis not present

## 2016-07-01 DIAGNOSIS — E785 Hyperlipidemia, unspecified: Secondary | ICD-10-CM | POA: Diagnosis not present

## 2016-07-02 DIAGNOSIS — E785 Hyperlipidemia, unspecified: Secondary | ICD-10-CM | POA: Diagnosis not present

## 2016-07-03 DIAGNOSIS — E785 Hyperlipidemia, unspecified: Secondary | ICD-10-CM | POA: Diagnosis not present

## 2016-07-04 DIAGNOSIS — E785 Hyperlipidemia, unspecified: Secondary | ICD-10-CM | POA: Diagnosis not present

## 2016-07-05 DIAGNOSIS — E785 Hyperlipidemia, unspecified: Secondary | ICD-10-CM | POA: Diagnosis not present

## 2016-07-06 DIAGNOSIS — E785 Hyperlipidemia, unspecified: Secondary | ICD-10-CM | POA: Diagnosis not present

## 2016-07-07 DIAGNOSIS — E785 Hyperlipidemia, unspecified: Secondary | ICD-10-CM | POA: Diagnosis not present

## 2016-07-08 DIAGNOSIS — E785 Hyperlipidemia, unspecified: Secondary | ICD-10-CM | POA: Diagnosis not present

## 2016-07-09 DIAGNOSIS — E785 Hyperlipidemia, unspecified: Secondary | ICD-10-CM | POA: Diagnosis not present

## 2016-07-10 DIAGNOSIS — E785 Hyperlipidemia, unspecified: Secondary | ICD-10-CM | POA: Diagnosis not present

## 2016-07-11 DIAGNOSIS — E785 Hyperlipidemia, unspecified: Secondary | ICD-10-CM | POA: Diagnosis not present

## 2016-07-12 DIAGNOSIS — G43909 Migraine, unspecified, not intractable, without status migrainosus: Secondary | ICD-10-CM | POA: Diagnosis not present

## 2016-07-12 DIAGNOSIS — F331 Major depressive disorder, recurrent, moderate: Secondary | ICD-10-CM | POA: Diagnosis not present

## 2016-07-12 DIAGNOSIS — E785 Hyperlipidemia, unspecified: Secondary | ICD-10-CM | POA: Diagnosis not present

## 2016-07-12 DIAGNOSIS — I10 Essential (primary) hypertension: Secondary | ICD-10-CM | POA: Diagnosis not present

## 2016-07-12 DIAGNOSIS — G301 Alzheimer's disease with late onset: Secondary | ICD-10-CM | POA: Diagnosis not present

## 2016-07-13 DIAGNOSIS — E785 Hyperlipidemia, unspecified: Secondary | ICD-10-CM | POA: Diagnosis not present

## 2016-07-14 DIAGNOSIS — E785 Hyperlipidemia, unspecified: Secondary | ICD-10-CM | POA: Diagnosis not present

## 2016-07-15 DIAGNOSIS — E785 Hyperlipidemia, unspecified: Secondary | ICD-10-CM | POA: Diagnosis not present

## 2016-07-16 DIAGNOSIS — E785 Hyperlipidemia, unspecified: Secondary | ICD-10-CM | POA: Diagnosis not present

## 2016-07-17 DIAGNOSIS — E785 Hyperlipidemia, unspecified: Secondary | ICD-10-CM | POA: Diagnosis not present

## 2016-07-18 DIAGNOSIS — E785 Hyperlipidemia, unspecified: Secondary | ICD-10-CM | POA: Diagnosis not present

## 2016-07-19 DIAGNOSIS — E785 Hyperlipidemia, unspecified: Secondary | ICD-10-CM | POA: Diagnosis not present

## 2016-07-19 DIAGNOSIS — R32 Unspecified urinary incontinence: Secondary | ICD-10-CM | POA: Diagnosis not present

## 2016-07-20 DIAGNOSIS — E785 Hyperlipidemia, unspecified: Secondary | ICD-10-CM | POA: Diagnosis not present

## 2016-07-21 DIAGNOSIS — E785 Hyperlipidemia, unspecified: Secondary | ICD-10-CM | POA: Diagnosis not present

## 2016-07-22 DIAGNOSIS — E785 Hyperlipidemia, unspecified: Secondary | ICD-10-CM | POA: Diagnosis not present

## 2016-07-23 DIAGNOSIS — E785 Hyperlipidemia, unspecified: Secondary | ICD-10-CM | POA: Diagnosis not present

## 2016-07-24 DIAGNOSIS — E785 Hyperlipidemia, unspecified: Secondary | ICD-10-CM | POA: Diagnosis not present

## 2016-07-25 DIAGNOSIS — E785 Hyperlipidemia, unspecified: Secondary | ICD-10-CM | POA: Diagnosis not present

## 2016-07-26 DIAGNOSIS — E785 Hyperlipidemia, unspecified: Secondary | ICD-10-CM | POA: Diagnosis not present

## 2016-07-27 DIAGNOSIS — E785 Hyperlipidemia, unspecified: Secondary | ICD-10-CM | POA: Diagnosis not present

## 2016-07-28 DIAGNOSIS — E785 Hyperlipidemia, unspecified: Secondary | ICD-10-CM | POA: Diagnosis not present

## 2016-07-29 DIAGNOSIS — E785 Hyperlipidemia, unspecified: Secondary | ICD-10-CM | POA: Diagnosis not present

## 2016-07-30 DIAGNOSIS — E785 Hyperlipidemia, unspecified: Secondary | ICD-10-CM | POA: Diagnosis not present

## 2016-07-31 DIAGNOSIS — E785 Hyperlipidemia, unspecified: Secondary | ICD-10-CM | POA: Diagnosis not present

## 2016-08-01 DIAGNOSIS — E785 Hyperlipidemia, unspecified: Secondary | ICD-10-CM | POA: Diagnosis not present

## 2016-08-02 DIAGNOSIS — E785 Hyperlipidemia, unspecified: Secondary | ICD-10-CM | POA: Diagnosis not present

## 2016-08-03 DIAGNOSIS — E785 Hyperlipidemia, unspecified: Secondary | ICD-10-CM | POA: Diagnosis not present

## 2016-08-04 DIAGNOSIS — E785 Hyperlipidemia, unspecified: Secondary | ICD-10-CM | POA: Diagnosis not present

## 2016-08-05 DIAGNOSIS — E785 Hyperlipidemia, unspecified: Secondary | ICD-10-CM | POA: Diagnosis not present

## 2016-08-06 DIAGNOSIS — E785 Hyperlipidemia, unspecified: Secondary | ICD-10-CM | POA: Diagnosis not present

## 2016-08-07 DIAGNOSIS — E785 Hyperlipidemia, unspecified: Secondary | ICD-10-CM | POA: Diagnosis not present

## 2016-08-08 DIAGNOSIS — E785 Hyperlipidemia, unspecified: Secondary | ICD-10-CM | POA: Diagnosis not present

## 2016-08-09 DIAGNOSIS — E785 Hyperlipidemia, unspecified: Secondary | ICD-10-CM | POA: Diagnosis not present

## 2016-08-10 DIAGNOSIS — E785 Hyperlipidemia, unspecified: Secondary | ICD-10-CM | POA: Diagnosis not present

## 2016-08-11 DIAGNOSIS — E785 Hyperlipidemia, unspecified: Secondary | ICD-10-CM | POA: Diagnosis not present

## 2016-08-12 DIAGNOSIS — E785 Hyperlipidemia, unspecified: Secondary | ICD-10-CM | POA: Diagnosis not present

## 2016-08-13 DIAGNOSIS — E785 Hyperlipidemia, unspecified: Secondary | ICD-10-CM | POA: Diagnosis not present

## 2016-08-14 DIAGNOSIS — E785 Hyperlipidemia, unspecified: Secondary | ICD-10-CM | POA: Diagnosis not present

## 2016-08-15 DIAGNOSIS — E785 Hyperlipidemia, unspecified: Secondary | ICD-10-CM | POA: Diagnosis not present

## 2016-08-16 DIAGNOSIS — E785 Hyperlipidemia, unspecified: Secondary | ICD-10-CM | POA: Diagnosis not present

## 2016-08-17 DIAGNOSIS — E785 Hyperlipidemia, unspecified: Secondary | ICD-10-CM | POA: Diagnosis not present

## 2016-08-18 DIAGNOSIS — E785 Hyperlipidemia, unspecified: Secondary | ICD-10-CM | POA: Diagnosis not present

## 2016-08-19 DIAGNOSIS — E785 Hyperlipidemia, unspecified: Secondary | ICD-10-CM | POA: Diagnosis not present

## 2016-08-20 DIAGNOSIS — E785 Hyperlipidemia, unspecified: Secondary | ICD-10-CM | POA: Diagnosis not present

## 2016-08-20 DIAGNOSIS — R32 Unspecified urinary incontinence: Secondary | ICD-10-CM | POA: Diagnosis not present

## 2016-08-21 DIAGNOSIS — E785 Hyperlipidemia, unspecified: Secondary | ICD-10-CM | POA: Diagnosis not present

## 2016-08-22 DIAGNOSIS — E785 Hyperlipidemia, unspecified: Secondary | ICD-10-CM | POA: Diagnosis not present

## 2016-08-23 DIAGNOSIS — E785 Hyperlipidemia, unspecified: Secondary | ICD-10-CM | POA: Diagnosis not present

## 2016-08-24 DIAGNOSIS — E785 Hyperlipidemia, unspecified: Secondary | ICD-10-CM | POA: Diagnosis not present

## 2016-08-25 DIAGNOSIS — E785 Hyperlipidemia, unspecified: Secondary | ICD-10-CM | POA: Diagnosis not present

## 2016-08-26 DIAGNOSIS — E785 Hyperlipidemia, unspecified: Secondary | ICD-10-CM | POA: Diagnosis not present

## 2016-08-27 DIAGNOSIS — E785 Hyperlipidemia, unspecified: Secondary | ICD-10-CM | POA: Diagnosis not present

## 2016-08-28 DIAGNOSIS — E785 Hyperlipidemia, unspecified: Secondary | ICD-10-CM | POA: Diagnosis not present

## 2016-08-29 DIAGNOSIS — E785 Hyperlipidemia, unspecified: Secondary | ICD-10-CM | POA: Diagnosis not present

## 2016-08-30 DIAGNOSIS — E785 Hyperlipidemia, unspecified: Secondary | ICD-10-CM | POA: Diagnosis not present

## 2016-08-31 DIAGNOSIS — E785 Hyperlipidemia, unspecified: Secondary | ICD-10-CM | POA: Diagnosis not present

## 2016-09-01 DIAGNOSIS — E785 Hyperlipidemia, unspecified: Secondary | ICD-10-CM | POA: Diagnosis not present

## 2016-09-02 DIAGNOSIS — E785 Hyperlipidemia, unspecified: Secondary | ICD-10-CM | POA: Diagnosis not present

## 2016-09-03 DIAGNOSIS — E785 Hyperlipidemia, unspecified: Secondary | ICD-10-CM | POA: Diagnosis not present

## 2016-09-04 DIAGNOSIS — E785 Hyperlipidemia, unspecified: Secondary | ICD-10-CM | POA: Diagnosis not present

## 2016-09-05 DIAGNOSIS — E785 Hyperlipidemia, unspecified: Secondary | ICD-10-CM | POA: Diagnosis not present

## 2016-09-06 DIAGNOSIS — W500XXA Accidental hit or strike by another person, initial encounter: Secondary | ICD-10-CM | POA: Diagnosis not present

## 2016-09-06 DIAGNOSIS — F331 Major depressive disorder, recurrent, moderate: Secondary | ICD-10-CM | POA: Diagnosis not present

## 2016-09-06 DIAGNOSIS — E785 Hyperlipidemia, unspecified: Secondary | ICD-10-CM | POA: Diagnosis not present

## 2016-09-06 DIAGNOSIS — G43909 Migraine, unspecified, not intractable, without status migrainosus: Secondary | ICD-10-CM | POA: Diagnosis not present

## 2016-09-06 DIAGNOSIS — G301 Alzheimer's disease with late onset: Secondary | ICD-10-CM | POA: Diagnosis not present

## 2016-09-06 DIAGNOSIS — I10 Essential (primary) hypertension: Secondary | ICD-10-CM | POA: Diagnosis not present

## 2016-09-07 DIAGNOSIS — E785 Hyperlipidemia, unspecified: Secondary | ICD-10-CM | POA: Diagnosis not present

## 2016-09-08 DIAGNOSIS — E785 Hyperlipidemia, unspecified: Secondary | ICD-10-CM | POA: Diagnosis not present

## 2016-09-09 DIAGNOSIS — H2513 Age-related nuclear cataract, bilateral: Secondary | ICD-10-CM | POA: Diagnosis not present

## 2016-09-09 DIAGNOSIS — E785 Hyperlipidemia, unspecified: Secondary | ICD-10-CM | POA: Diagnosis not present

## 2016-09-10 DIAGNOSIS — E785 Hyperlipidemia, unspecified: Secondary | ICD-10-CM | POA: Diagnosis not present

## 2016-09-11 DIAGNOSIS — E785 Hyperlipidemia, unspecified: Secondary | ICD-10-CM | POA: Diagnosis not present

## 2016-09-12 DIAGNOSIS — E785 Hyperlipidemia, unspecified: Secondary | ICD-10-CM | POA: Diagnosis not present

## 2016-09-13 DIAGNOSIS — E785 Hyperlipidemia, unspecified: Secondary | ICD-10-CM | POA: Diagnosis not present

## 2016-09-14 DIAGNOSIS — E785 Hyperlipidemia, unspecified: Secondary | ICD-10-CM | POA: Diagnosis not present

## 2016-09-15 DIAGNOSIS — E785 Hyperlipidemia, unspecified: Secondary | ICD-10-CM | POA: Diagnosis not present

## 2016-09-16 DIAGNOSIS — E785 Hyperlipidemia, unspecified: Secondary | ICD-10-CM | POA: Diagnosis not present

## 2016-09-17 DIAGNOSIS — E785 Hyperlipidemia, unspecified: Secondary | ICD-10-CM | POA: Diagnosis not present

## 2016-09-18 DIAGNOSIS — E785 Hyperlipidemia, unspecified: Secondary | ICD-10-CM | POA: Diagnosis not present

## 2016-09-19 DIAGNOSIS — E785 Hyperlipidemia, unspecified: Secondary | ICD-10-CM | POA: Diagnosis not present

## 2016-09-20 DIAGNOSIS — E785 Hyperlipidemia, unspecified: Secondary | ICD-10-CM | POA: Diagnosis not present

## 2016-09-20 DIAGNOSIS — R32 Unspecified urinary incontinence: Secondary | ICD-10-CM | POA: Diagnosis not present

## 2016-09-21 DIAGNOSIS — E785 Hyperlipidemia, unspecified: Secondary | ICD-10-CM | POA: Diagnosis not present

## 2016-09-22 DIAGNOSIS — E785 Hyperlipidemia, unspecified: Secondary | ICD-10-CM | POA: Diagnosis not present

## 2016-09-23 DIAGNOSIS — E785 Hyperlipidemia, unspecified: Secondary | ICD-10-CM | POA: Diagnosis not present

## 2016-09-24 DIAGNOSIS — E785 Hyperlipidemia, unspecified: Secondary | ICD-10-CM | POA: Diagnosis not present

## 2016-09-25 DIAGNOSIS — E785 Hyperlipidemia, unspecified: Secondary | ICD-10-CM | POA: Diagnosis not present

## 2016-09-26 DIAGNOSIS — E785 Hyperlipidemia, unspecified: Secondary | ICD-10-CM | POA: Diagnosis not present

## 2016-09-27 DIAGNOSIS — E785 Hyperlipidemia, unspecified: Secondary | ICD-10-CM | POA: Diagnosis not present

## 2016-09-28 DIAGNOSIS — E785 Hyperlipidemia, unspecified: Secondary | ICD-10-CM | POA: Diagnosis not present

## 2016-09-29 DIAGNOSIS — E785 Hyperlipidemia, unspecified: Secondary | ICD-10-CM | POA: Diagnosis not present

## 2016-09-30 DIAGNOSIS — E785 Hyperlipidemia, unspecified: Secondary | ICD-10-CM | POA: Diagnosis not present

## 2016-10-01 DIAGNOSIS — E785 Hyperlipidemia, unspecified: Secondary | ICD-10-CM | POA: Diagnosis not present

## 2016-10-02 DIAGNOSIS — E785 Hyperlipidemia, unspecified: Secondary | ICD-10-CM | POA: Diagnosis not present

## 2016-10-03 DIAGNOSIS — E785 Hyperlipidemia, unspecified: Secondary | ICD-10-CM | POA: Diagnosis not present

## 2016-10-04 DIAGNOSIS — E785 Hyperlipidemia, unspecified: Secondary | ICD-10-CM | POA: Diagnosis not present

## 2016-10-05 DIAGNOSIS — E785 Hyperlipidemia, unspecified: Secondary | ICD-10-CM | POA: Diagnosis not present

## 2016-10-06 DIAGNOSIS — E785 Hyperlipidemia, unspecified: Secondary | ICD-10-CM | POA: Diagnosis not present

## 2016-10-07 DIAGNOSIS — E785 Hyperlipidemia, unspecified: Secondary | ICD-10-CM | POA: Diagnosis not present

## 2016-10-08 DIAGNOSIS — E785 Hyperlipidemia, unspecified: Secondary | ICD-10-CM | POA: Diagnosis not present

## 2016-10-09 DIAGNOSIS — E785 Hyperlipidemia, unspecified: Secondary | ICD-10-CM | POA: Diagnosis not present

## 2016-10-10 DIAGNOSIS — E785 Hyperlipidemia, unspecified: Secondary | ICD-10-CM | POA: Diagnosis not present

## 2016-10-11 DIAGNOSIS — E785 Hyperlipidemia, unspecified: Secondary | ICD-10-CM | POA: Diagnosis not present

## 2016-10-12 DIAGNOSIS — E785 Hyperlipidemia, unspecified: Secondary | ICD-10-CM | POA: Diagnosis not present

## 2016-10-13 DIAGNOSIS — E785 Hyperlipidemia, unspecified: Secondary | ICD-10-CM | POA: Diagnosis not present

## 2016-10-14 DIAGNOSIS — E785 Hyperlipidemia, unspecified: Secondary | ICD-10-CM | POA: Diagnosis not present

## 2016-10-15 DIAGNOSIS — E785 Hyperlipidemia, unspecified: Secondary | ICD-10-CM | POA: Diagnosis not present

## 2016-10-16 DIAGNOSIS — E785 Hyperlipidemia, unspecified: Secondary | ICD-10-CM | POA: Diagnosis not present

## 2016-10-17 DIAGNOSIS — E785 Hyperlipidemia, unspecified: Secondary | ICD-10-CM | POA: Diagnosis not present

## 2016-10-18 DIAGNOSIS — E785 Hyperlipidemia, unspecified: Secondary | ICD-10-CM | POA: Diagnosis not present

## 2016-10-19 DIAGNOSIS — R32 Unspecified urinary incontinence: Secondary | ICD-10-CM | POA: Diagnosis not present

## 2016-10-19 DIAGNOSIS — E785 Hyperlipidemia, unspecified: Secondary | ICD-10-CM | POA: Diagnosis not present

## 2016-10-20 DIAGNOSIS — E785 Hyperlipidemia, unspecified: Secondary | ICD-10-CM | POA: Diagnosis not present

## 2016-10-21 DIAGNOSIS — E785 Hyperlipidemia, unspecified: Secondary | ICD-10-CM | POA: Diagnosis not present

## 2016-10-22 DIAGNOSIS — E785 Hyperlipidemia, unspecified: Secondary | ICD-10-CM | POA: Diagnosis not present

## 2016-10-23 DIAGNOSIS — E785 Hyperlipidemia, unspecified: Secondary | ICD-10-CM | POA: Diagnosis not present

## 2016-10-24 DIAGNOSIS — N390 Urinary tract infection, site not specified: Secondary | ICD-10-CM | POA: Diagnosis not present

## 2016-10-24 DIAGNOSIS — R319 Hematuria, unspecified: Secondary | ICD-10-CM | POA: Diagnosis not present

## 2016-10-24 DIAGNOSIS — E785 Hyperlipidemia, unspecified: Secondary | ICD-10-CM | POA: Diagnosis not present

## 2016-10-25 DIAGNOSIS — E785 Hyperlipidemia, unspecified: Secondary | ICD-10-CM | POA: Diagnosis not present

## 2016-10-26 DIAGNOSIS — G301 Alzheimer's disease with late onset: Secondary | ICD-10-CM | POA: Diagnosis not present

## 2016-10-26 DIAGNOSIS — E785 Hyperlipidemia, unspecified: Secondary | ICD-10-CM | POA: Diagnosis not present

## 2016-10-26 DIAGNOSIS — F331 Major depressive disorder, recurrent, moderate: Secondary | ICD-10-CM | POA: Diagnosis not present

## 2016-10-26 DIAGNOSIS — R451 Restlessness and agitation: Secondary | ICD-10-CM | POA: Diagnosis not present

## 2016-10-26 DIAGNOSIS — F0281 Dementia in other diseases classified elsewhere with behavioral disturbance: Secondary | ICD-10-CM | POA: Diagnosis not present

## 2016-10-27 DIAGNOSIS — E785 Hyperlipidemia, unspecified: Secondary | ICD-10-CM | POA: Diagnosis not present

## 2016-10-28 DIAGNOSIS — E785 Hyperlipidemia, unspecified: Secondary | ICD-10-CM | POA: Diagnosis not present

## 2016-10-31 DIAGNOSIS — E784 Other hyperlipidemia: Secondary | ICD-10-CM | POA: Diagnosis not present

## 2016-10-31 DIAGNOSIS — E559 Vitamin D deficiency, unspecified: Secondary | ICD-10-CM | POA: Diagnosis not present

## 2016-10-31 DIAGNOSIS — G301 Alzheimer's disease with late onset: Secondary | ICD-10-CM | POA: Diagnosis not present

## 2016-10-31 DIAGNOSIS — M810 Age-related osteoporosis without current pathological fracture: Secondary | ICD-10-CM | POA: Diagnosis not present

## 2016-11-05 DIAGNOSIS — E785 Hyperlipidemia, unspecified: Secondary | ICD-10-CM | POA: Diagnosis not present

## 2016-11-06 DIAGNOSIS — E785 Hyperlipidemia, unspecified: Secondary | ICD-10-CM | POA: Diagnosis not present

## 2016-11-07 DIAGNOSIS — E785 Hyperlipidemia, unspecified: Secondary | ICD-10-CM | POA: Diagnosis not present

## 2016-11-08 DIAGNOSIS — E785 Hyperlipidemia, unspecified: Secondary | ICD-10-CM | POA: Diagnosis not present

## 2016-11-09 DIAGNOSIS — E785 Hyperlipidemia, unspecified: Secondary | ICD-10-CM | POA: Diagnosis not present

## 2016-11-10 DIAGNOSIS — E785 Hyperlipidemia, unspecified: Secondary | ICD-10-CM | POA: Diagnosis not present

## 2016-11-11 DIAGNOSIS — E785 Hyperlipidemia, unspecified: Secondary | ICD-10-CM | POA: Diagnosis not present

## 2016-11-12 DIAGNOSIS — E785 Hyperlipidemia, unspecified: Secondary | ICD-10-CM | POA: Diagnosis not present

## 2016-11-13 DIAGNOSIS — E785 Hyperlipidemia, unspecified: Secondary | ICD-10-CM | POA: Diagnosis not present

## 2016-11-14 DIAGNOSIS — E785 Hyperlipidemia, unspecified: Secondary | ICD-10-CM | POA: Diagnosis not present

## 2016-11-15 DIAGNOSIS — E785 Hyperlipidemia, unspecified: Secondary | ICD-10-CM | POA: Diagnosis not present

## 2016-11-16 DIAGNOSIS — E785 Hyperlipidemia, unspecified: Secondary | ICD-10-CM | POA: Diagnosis not present

## 2016-11-17 DIAGNOSIS — E785 Hyperlipidemia, unspecified: Secondary | ICD-10-CM | POA: Diagnosis not present

## 2016-11-18 DIAGNOSIS — E785 Hyperlipidemia, unspecified: Secondary | ICD-10-CM | POA: Diagnosis not present

## 2016-11-19 DIAGNOSIS — E785 Hyperlipidemia, unspecified: Secondary | ICD-10-CM | POA: Diagnosis not present

## 2016-11-20 DIAGNOSIS — E785 Hyperlipidemia, unspecified: Secondary | ICD-10-CM | POA: Diagnosis not present

## 2016-11-21 DIAGNOSIS — E785 Hyperlipidemia, unspecified: Secondary | ICD-10-CM | POA: Diagnosis not present

## 2016-11-22 DIAGNOSIS — E785 Hyperlipidemia, unspecified: Secondary | ICD-10-CM | POA: Diagnosis not present

## 2016-11-23 DIAGNOSIS — R32 Unspecified urinary incontinence: Secondary | ICD-10-CM | POA: Diagnosis not present

## 2016-11-23 DIAGNOSIS — E785 Hyperlipidemia, unspecified: Secondary | ICD-10-CM | POA: Diagnosis not present

## 2016-11-24 DIAGNOSIS — E785 Hyperlipidemia, unspecified: Secondary | ICD-10-CM | POA: Diagnosis not present

## 2016-11-25 DIAGNOSIS — E785 Hyperlipidemia, unspecified: Secondary | ICD-10-CM | POA: Diagnosis not present

## 2016-11-26 DIAGNOSIS — E785 Hyperlipidemia, unspecified: Secondary | ICD-10-CM | POA: Diagnosis not present

## 2016-11-27 DIAGNOSIS — E785 Hyperlipidemia, unspecified: Secondary | ICD-10-CM | POA: Diagnosis not present

## 2016-11-28 DIAGNOSIS — E785 Hyperlipidemia, unspecified: Secondary | ICD-10-CM | POA: Diagnosis not present

## 2016-11-29 DIAGNOSIS — F331 Major depressive disorder, recurrent, moderate: Secondary | ICD-10-CM | POA: Diagnosis not present

## 2016-11-29 DIAGNOSIS — R451 Restlessness and agitation: Secondary | ICD-10-CM | POA: Diagnosis not present

## 2016-11-29 DIAGNOSIS — F0280 Dementia in other diseases classified elsewhere without behavioral disturbance: Secondary | ICD-10-CM | POA: Diagnosis not present

## 2016-11-29 DIAGNOSIS — E785 Hyperlipidemia, unspecified: Secondary | ICD-10-CM | POA: Diagnosis not present

## 2016-11-29 DIAGNOSIS — G301 Alzheimer's disease with late onset: Secondary | ICD-10-CM | POA: Diagnosis not present

## 2016-11-30 DIAGNOSIS — E785 Hyperlipidemia, unspecified: Secondary | ICD-10-CM | POA: Diagnosis not present

## 2016-12-01 DIAGNOSIS — E785 Hyperlipidemia, unspecified: Secondary | ICD-10-CM | POA: Diagnosis not present

## 2016-12-02 DIAGNOSIS — E785 Hyperlipidemia, unspecified: Secondary | ICD-10-CM | POA: Diagnosis not present

## 2016-12-03 DIAGNOSIS — E785 Hyperlipidemia, unspecified: Secondary | ICD-10-CM | POA: Diagnosis not present

## 2016-12-04 DIAGNOSIS — E785 Hyperlipidemia, unspecified: Secondary | ICD-10-CM | POA: Diagnosis not present

## 2016-12-05 DIAGNOSIS — E785 Hyperlipidemia, unspecified: Secondary | ICD-10-CM | POA: Diagnosis not present

## 2016-12-06 DIAGNOSIS — E785 Hyperlipidemia, unspecified: Secondary | ICD-10-CM | POA: Diagnosis not present

## 2016-12-07 DIAGNOSIS — E785 Hyperlipidemia, unspecified: Secondary | ICD-10-CM | POA: Diagnosis not present

## 2016-12-08 DIAGNOSIS — E785 Hyperlipidemia, unspecified: Secondary | ICD-10-CM | POA: Diagnosis not present

## 2016-12-09 DIAGNOSIS — E785 Hyperlipidemia, unspecified: Secondary | ICD-10-CM | POA: Diagnosis not present

## 2016-12-10 DIAGNOSIS — E785 Hyperlipidemia, unspecified: Secondary | ICD-10-CM | POA: Diagnosis not present

## 2016-12-11 DIAGNOSIS — E785 Hyperlipidemia, unspecified: Secondary | ICD-10-CM | POA: Diagnosis not present

## 2016-12-12 DIAGNOSIS — E785 Hyperlipidemia, unspecified: Secondary | ICD-10-CM | POA: Diagnosis not present

## 2016-12-13 DIAGNOSIS — E785 Hyperlipidemia, unspecified: Secondary | ICD-10-CM | POA: Diagnosis not present

## 2016-12-14 DIAGNOSIS — E785 Hyperlipidemia, unspecified: Secondary | ICD-10-CM | POA: Diagnosis not present

## 2016-12-15 DIAGNOSIS — E785 Hyperlipidemia, unspecified: Secondary | ICD-10-CM | POA: Diagnosis not present

## 2016-12-16 DIAGNOSIS — E785 Hyperlipidemia, unspecified: Secondary | ICD-10-CM | POA: Diagnosis not present

## 2016-12-17 DIAGNOSIS — E785 Hyperlipidemia, unspecified: Secondary | ICD-10-CM | POA: Diagnosis not present

## 2016-12-17 DIAGNOSIS — R404 Transient alteration of awareness: Secondary | ICD-10-CM | POA: Diagnosis not present

## 2016-12-17 DIAGNOSIS — R531 Weakness: Secondary | ICD-10-CM | POA: Diagnosis not present

## 2016-12-18 DIAGNOSIS — E785 Hyperlipidemia, unspecified: Secondary | ICD-10-CM | POA: Diagnosis not present

## 2016-12-19 ENCOUNTER — Emergency Department (HOSPITAL_COMMUNITY)
Admission: EM | Admit: 2016-12-19 | Discharge: 2016-12-19 | Disposition: A | Discharge status: Home / Self Care | Payer: Medicare HMO | Attending: Emergency Medicine | Admitting: Emergency Medicine

## 2016-12-19 ENCOUNTER — Encounter (HOSPITAL_COMMUNITY): Payer: Self-pay | Admitting: Emergency Medicine

## 2016-12-19 ENCOUNTER — Emergency Department (HOSPITAL_COMMUNITY): Payer: Medicare HMO

## 2016-12-19 DIAGNOSIS — R918 Other nonspecific abnormal finding of lung field: Secondary | ICD-10-CM | POA: Diagnosis not present

## 2016-12-19 DIAGNOSIS — Z79899 Other long term (current) drug therapy: Secondary | ICD-10-CM | POA: Diagnosis not present

## 2016-12-19 DIAGNOSIS — F0280 Dementia in other diseases classified elsewhere without behavioral disturbance: Secondary | ICD-10-CM | POA: Diagnosis not present

## 2016-12-19 DIAGNOSIS — E876 Hypokalemia: Secondary | ICD-10-CM | POA: Insufficient documentation

## 2016-12-19 DIAGNOSIS — M19032 Primary osteoarthritis, left wrist: Secondary | ICD-10-CM | POA: Diagnosis not present

## 2016-12-19 DIAGNOSIS — I10 Essential (primary) hypertension: Secondary | ICD-10-CM | POA: Diagnosis not present

## 2016-12-19 DIAGNOSIS — G309 Alzheimer's disease, unspecified: Secondary | ICD-10-CM | POA: Insufficient documentation

## 2016-12-19 DIAGNOSIS — R000 Tachycardia, unspecified: Secondary | ICD-10-CM | POA: Diagnosis not present

## 2016-12-19 DIAGNOSIS — R4182 Altered mental status, unspecified: Secondary | ICD-10-CM | POA: Diagnosis not present

## 2016-12-19 DIAGNOSIS — E785 Hyperlipidemia, unspecified: Secondary | ICD-10-CM | POA: Diagnosis not present

## 2016-12-19 HISTORY — DX: Bradycardia, unspecified: R00.1

## 2016-12-19 LAB — URINALYSIS, ROUTINE W REFLEX MICROSCOPIC
Bacteria, UA: NONE SEEN
Bilirubin Urine: NEGATIVE
Glucose, UA: NEGATIVE mg/dL
HGB URINE DIPSTICK: NEGATIVE
KETONES UR: 20 mg/dL — AB
Leukocytes, UA: NEGATIVE
Nitrite: NEGATIVE
PROTEIN: 30 mg/dL — AB
Specific Gravity, Urine: 1.028 (ref 1.005–1.030)
pH: 5 (ref 5.0–8.0)

## 2016-12-19 LAB — COMPREHENSIVE METABOLIC PANEL
ALBUMIN: 2.9 g/dL — AB (ref 3.5–5.0)
ALT: 22 U/L (ref 14–54)
AST: 47 U/L — AB (ref 15–41)
Alkaline Phosphatase: 40 U/L (ref 38–126)
Anion gap: 9 (ref 5–15)
BUN: 17 mg/dL (ref 6–20)
CHLORIDE: 115 mmol/L — AB (ref 101–111)
CO2: 24 mmol/L (ref 22–32)
Calcium: 9.1 mg/dL (ref 8.9–10.3)
Creatinine, Ser: 0.82 mg/dL (ref 0.44–1.00)
GFR calc Af Amer: >60 mL/min (ref >60)
GFR calc non Af Amer: >60 mL/min (ref >60)
Glucose, Bld: 128 mg/dL — ABNORMAL HIGH (ref 65–99)
POTASSIUM: 2.9 mmol/L — AB (ref 3.5–5.1)
Sodium: 148 mmol/L — ABNORMAL HIGH (ref 135–145)
Total Bilirubin: 0.6 mg/dL (ref 0.3–1.2)
Total Protein: 6.8 g/dL (ref 6.5–8.1)

## 2016-12-19 LAB — CBC
HEMATOCRIT: 37.8 % (ref 36.0–46.0)
Hemoglobin: 12.7 g/dL (ref 12.0–15.0)
MCH: 30.7 pg (ref 26.0–34.0)
MCHC: 33.6 g/dL (ref 30.0–36.0)
MCV: 91.3 fL (ref 78.0–100.0)
Platelets: 242 10*3/uL (ref 150–400)
RBC: 4.14 MIL/uL (ref 3.87–5.11)
RDW: 13.9 % (ref 11.5–15.5)
WBC: 13.1 10*3/uL — ABNORMAL HIGH (ref 4.0–10.5)

## 2016-12-19 LAB — TROPONIN I: Troponin I: <0.03 ng/mL (ref <0.03)

## 2016-12-19 LAB — AMMONIA: Ammonia: 18 umol/L (ref 9–35)

## 2016-12-19 MED ORDER — DEXAMETHASONE SODIUM PHOSPHATE 4 MG/ML IJ SOLN
8.0000 mg | Freq: Once | INTRAMUSCULAR | Status: AC
  Administered 2016-12-19: 8 mg via INTRAMUSCULAR
  Filled 2016-12-19: qty 2

## 2016-12-19 MED ORDER — POTASSIUM CHLORIDE CRYS ER 20 MEQ PO TBCR
40.0000 meq | EXTENDED_RELEASE_TABLET | Freq: Once | ORAL | Status: AC
  Administered 2016-12-19: 40 meq via ORAL
  Filled 2016-12-19: qty 2

## 2016-12-19 MED ORDER — POTASSIUM CHLORIDE CRYS ER 20 MEQ PO TBCR: 20.0000 meq | EXTENDED_RELEASE_TABLET | Freq: Once | ORAL | 0 refills | Status: AC

## 2016-12-19 MED ORDER — ACETAMINOPHEN 500 MG PO TABS
1000.0000 mg | ORAL_TABLET | Freq: Once | ORAL | Status: AC
  Administered 2016-12-19: 1000 mg via ORAL
  Filled 2016-12-19: qty 2

## 2016-12-19 NOTE — ED Notes (Signed)
Attempted to call report to [facility] at this time, phone continually rings with no answer.

## 2016-12-19 NOTE — ED Notes (Signed)
Pt combative at this time during vital signs, took thermometer out of my hand and began swinging it around pointing it toward staff.  PT states "get this off of me, yall get out of here, I want to go home. " Pt is taking off bp cuff at this time.

## 2016-12-19 NOTE — ED Notes (Signed)
Spoke with [name] at [facility], states driver went to wrong hospital and is currently with another resident, ETA approx 1 hour. Family can take pt to care facility if they choose per [name].

## 2016-12-19 NOTE — Discharge Instructions (Signed)
The cardiac enzyme test is negative for acute cardiac event. The chest x-ray is negative for acute problem. The wrist x-ray shows multiple areas of arthritis. The CT scan of the head is negative for stroke, bleed, skull fracture, or other acute problems. The potassium is low, and the proteins in the system are low. Please use the potassium daily and have this rechecked in 7-10 days on. Please increase diet, or high protein drinks. The unresponsive episode that was reported by the nursing staff at the nursing facility is probably related to an exacerbation of the dementia. No acute problem noted at this time during the workup.

## 2016-12-19 NOTE — ED Notes (Signed)
Pt resting with eyes closed, appears to be in no distress. Respirations are even and unlabored.  

## 2016-12-19 NOTE — ED Notes (Signed)
Pt to take daughter to [facility].

## 2016-12-19 NOTE — ED Notes (Signed)
Report given to RN Dahlia ClientHannah at [facility], per RN transport will come pick pt up.

## 2016-12-19 NOTE — ED Notes (Signed)
Unable to assess pt at this time due to being combative.

## 2016-12-19 NOTE — ED Triage Notes (Signed)
Per EMS: Pt from [facility] here for well visit.  EMS was called out for unresponsiveness, was alert and oriented per pt normal when ems arrived, pt does have dementia. Staff reports she was not acting the same as normal starting yesterday. No LOC noted, but it took a while for staff to arouse pt.  Pt sitting up at this time.  Vitals WNL : 114cbg

## 2016-12-19 NOTE — ED Provider Notes (Signed)
[facility]-EM[facility]ENCY DEPT Provider Note   CSN: [id number] Arrival date & time: 12/19/16  0712     History   Chief Complaint Chief Complaint  Patient presents with  . Altered Mental Status    HPI Barbara Ruiz is a 71 y.o. female.  Patient is a 71 year old female from a local nursing facility who presents to the emergency department by EMS because of an episode of unresponsiveness.  EMS reports that they were called to the facility because of unresponsiveness. They arrived they found that the patient was awake and alert. The patient suffers from dementia the staff reports that the patient was not acting her usual self. They started noticing changes on yesterday February 27. The staff has not noticed any loss of consciousness. They've not reported any high fevers. His been no reported falls recently on. The staff was contacted by nursing. The staff nurse reports that nursing staff called the patient's name a few times and it took a while for the patient to respond. They state this is different than the patient's usual posture.  Interview with the patient's family revealed that some of the changes that they are seeing her different from what the patient had been acting and responding. They also report that they've noticed some swelling and redness of the left wrist. The patient is been seen at the [facility] for this, but the family states they have not received any information concerning the findings during the [facility] visit. Patient presents now for evaluation concerning the change in responsiveness, and possible altered mental status.  LEVEL V CAVEAT - DEMENTIA      Past Medical History:  Diagnosis Date  . Alzheimer's dementia   . Bradycardia   . Hypertension   . Migraine   . Thyroid disease     There are no active problems to display for this patient.   Past Surgical History:  Procedure Laterality Date  . ABDOMINAL HYSTERECTOMY    . BACK SURGERY    . CARPAL TUNNEL  RELEASE    . thyroid removed      OB History    No data available       Home Medications    Prior to Admission medications   Medication Sig Start Date End Date Taking? Authorizing Provider  acetaminophen (TYLENOL) 500 MG tablet Take 500 mg by mouth every 4 (four) hours as needed.   Yes Historical Provider, MD  alum & mag hydroxide-simeth (MAALOX/MYLANTA) 200-200-20 MG/5ML suspension Take 30 mLs by mouth every 6 (six) hours as needed for indigestion or heartburn.   Yes Historical Provider, MD  amLODipine (NORVASC) 2.5 MG tablet Take 2.5 mg by mouth daily.   Yes Historical Provider, MD  divalproex (DEPAKOTE SPRINKLE) 125 MG capsule Take 375 mg by mouth 2 (two) times daily.   Yes Historical Provider, MD  donepezil (ARICEPT) 10 MG tablet Take 10 mg by mouth daily. 03/27/16  Yes Historical Provider, MD  guaiFENesin-codeine (ROBITUSSIN AC) 100-10 MG/5ML syrup Take 10 mLs by mouth 3 (three) times daily as needed for cough.   Yes Historical Provider, MD  lisinopril (PRINIVIL,ZESTRIL) 10 MG tablet Take 10 mg by mouth daily.   Yes Historical Provider, MD  loperamide (IMODIUM) 2 MG capsule Take 2 mg by mouth as needed for diarrhea or loose stools.   Yes Historical Provider, MD  magnesium hydroxide (MILK OF MAGNESIA) 400 MG/5ML suspension Take 30 mLs by mouth at bedtime as needed for mild constipation.   Yes Historical Provider, MD  memantine [facility])  5 MG tablet Take 5 mg by mouth 2 (two) times daily. 03/07/16  Yes Historical Provider, MD  naproxen (NAPROSYN) 250 MG tablet Take 250 mg by mouth daily.   Yes Historical Provider, MD  nitrofurantoin (MACRODANTIN) 100 MG capsule Take 100 mg by mouth 2 (two) times daily.   Yes Historical Provider, MD  potassium chloride SA (K-DUR,KLOR-CON) 20 MEQ tablet Take 20 mEq by mouth daily.   Yes Historical Provider, MD  topiramate (TOPAMAX) 100 MG tablet Take 100 mg by mouth daily. 03/27/16  Yes Historical Provider, MD  traZODone (DESYREL) 150 MG tablet Take 150 mg by  mouth at bedtime.   Yes Historical Provider, MD  vitamin E 100 UNIT capsule Take 400 Units by mouth daily.    Yes Historical Provider, MD    Family History History reviewed. No pertinent family history.  Social History Social History  Substance Use Topics  . Smoking status: Never Smoker  . Smokeless tobacco: Not on file  . Alcohol use No     Allergies   Patient has no known allergies.   Review of Systems Review of Systems  Unable to perform ROS: Dementia     Physical Exam Updated Vital Signs BP 102/59   Pulse 63   Temp 99.2 F (37.3 C) (Oral)   Resp 19   Wt 77.1 kg   SpO2 97%   BMI 30.11 kg/m   Physical Exam  Constitutional: She appears well-developed and well-nourished. No distress.  HENT:  Head: Normocephalic and atraumatic.  Right Ear: External ear normal.  Left Ear: External ear normal.  Eyes: Conjunctivae are normal. Right eye exhibits no discharge. Left eye exhibits no discharge. No scleral icterus.  Neck: Neck supple. No tracheal deviation present.  Cardiovascular: Normal rate, regular rhythm and intact distal pulses.  Exam reveals no gallop and no friction rub.   Pulmonary/Chest: Effort normal and breath sounds normal. No stridor. No respiratory distress. She has no wheezes. She has no rales.  Abdominal: Soft. Bowel sounds are normal. She exhibits no distension. There is no tenderness. There is no rebound.  Musculoskeletal: She exhibits tenderness. She exhibits no edema.  There is increase redness present of the left wrist on. The wrist is warm, but not hot. There no red streaks appreciated. There no broken skin areas. The patient winces with pain in the wrist is moved.  Neurological: She is alert. She has normal strength. No cranial nerve deficit (no facial droop, extraocular movements intact, no slurred speech) or sensory deficit. She exhibits normal muscle tone. She displays no seizure activity.  Skin: Skin is warm and dry. No rash noted.  Psychiatric:    Patient confused. Unable to carry out thought and conversation. She does seem to recognize her daughters. She is unsure of where she is.  Nursing note and vitals reviewed.    ED Treatments / Results  Labs (all labs ordered are listed, but only abnormal results are displayed) Labs Reviewed  COMPREHENSIVE METABOLIC PANEL - Abnormal; Notable for the following:       Result Value   Sodium 148 (*)    Potassium 2.9 (*)    Chloride 115 (*)    Glucose, Bld 128 (*)    Albumin 2.9 (*)    AST 47 (*)    All other components within normal limits  CBC - Abnormal; Notable for the following:    WBC 13.1 (*)    All other components within normal limits  URINALYSIS, ROUTINE W REFLEX MICROSCOPIC - Abnormal; Notable  for the following:    Color, Urine AMBER (*)    Ketones, ur 20 (*)    Protein, ur 30 (*)    All other components within normal limits  AMMONIA  TROPONIN I    EKG  EKG Interpretation  Date/Time:  Wednesday December 19 2016 08:31:46 EST Ventricular Rate:  55 PR Interval:    QRS Duration: 91 QT Interval:  436 QTC Calculation: 417 R Axis:   18 Text Interpretation:  sinus bradycardia Nonspecific T abnormalities, anterior leads Baseline wander in lead(s) V2 baseline artifact no significant change since june 2017 Confirmed by Criss AlvineGOLDSTON MD, SCOTT [id number]) on 12/19/2016 8:56:27 AM       Radiology Dg Wrist Complete Left  Result Date: 12/19/2016 CLINICAL DATA:  Pain and redness EXAM: LEFT WRIST - COMPLETE 3+ VIEW COMPARISON:  None. FINDINGS: Frontal, oblique, lateral, and ulnar deviation scaphoid images obtained. No evident fracture or dislocation. There is moderate narrowing of the first carpal -metacarpal joint. There is slightly milder narrowing in the proximal carpal row as well as at the scaphoid trapezial joint. No erosive change or bony destruction. There is calcification in the triangular fibrocartilage region. Pronator quadratus fat pad is not elevated. Bones appear somewhat  osteoporotic. IMPRESSION: No fracture or dislocation. No erosive change or bony destruction. 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 Calcification in the triangular fibrocartilage region may indicate chronic tear in this region. Bones appear osteoporotic. Electronically Signed   By: Bretta BangWilliam  Woodruff III M.D.   On: 12/19/2016 11:10   Ct Head Wo Contrast  Result Date: 12/19/2016 CLINICAL DATA:  Altered mental status, unresponsiveness, history of dementia EXAM: CT HEAD WITHOUT CONTRAST TECHNIQUE: Contiguous axial images were obtained from the base of the skull through the vertex without intravenous contrast. COMPARISON:  CT brain scan of 04/02/2016 FINDINGS: Brain: The ventricular system is slightly more prominent, and the cortical sulci are prominent indicative of progressive diffuse atrophy. The fourth ventricle and basilar cisterns are unremarkable. The septum is midline in position. Minimal small vessel ischemic changes noted in the periventricular white matter. However no hemorrhage, mass lesion, or acute infarction is seen. Vascular: No vascular abnormality is seen on this unenhanced study. Skull: No calvarial abnormality is seen. Sinuses/Orbits: The paranasal sinuses appear well pneumatized. Other: None. IMPRESSION: Atrophy and minimal small vessel ischemic change. No acute intracranial abnormality. Electronically Signed   By: Dwyane DeePaul  Barry M.D.   On: 12/19/2016 11:20   Dg Chest Portable 1 View  Result Date: 12/19/2016 CLINICAL DATA:  Altered mental status, history of hypertension, nonsmoker. Limited history available. EXAM: PORTABLE CHEST 1 VIEW COMPARISON:  PA and lateral chest x-ray of April 02, 2016 FINDINGS: The lungs are mildly hypoinflated. There is no focal infiltrate or pleural effusion. The heart is top-normal in size. The pulmonary vascularity is normal. There are surgical clips at the base of the neck. The trachea is deviated toward the left but this  is stable. The bony thorax exhibits no acute abnormality. There are mild degenerative changes of the shoulders. IMPRESSION: Hypoinflation limits the study. There is no definite acute cardiopulmonary abnormality. There is stable deviation of the trachea toward the left with surgical clips at the base of the neck. History of previous thyroid surgery. Electronically Signed   By: David  Swazil[place]Jordan M.D.   On: 12/19/2016 08:49    Procedures Procedures (including critical care time)  Medications Ordered in ED Medications  acetaminophen (TYLENOL) tablet 1,000 mg (not administered)  dexamethasone (DECADRON) injection 8 mg (  not administered)  potassium chloride SA (K-DUR,KLOR-CON) CR tablet 40 mEq (40 mEq Oral Given 12/19/16 1119)     Initial Impression / Assessment and Plan / ED Course  I have reviewed the triage vital signs and the nursing notes.  Pertinent labs & imaging results that were available during my care of the patient were reviewed by me and considered in my medical decision making (see chart for details).     *I have reviewed nursing notes, vital signs, and all appropriate lab and imaging results for this patient.**  Final Clinical Impressions(s) / ED Diagnoses  mdm Vital signs reviewed. Patient was initially on cooperative upon arrival. She did want not on allow the nursing staff to apply her blood pressure cuff or take her temperature. She gradually calmed down. She remained calm while her family members were in the room.  Urinalysis shows some protein in the urine, otherwise negative. The competence of metabolic panel shows potassium to be low at 2.9. Oral potassium given to the patient. The albumin is low at 2.9. The sodium and chloride are slightly elevated. Otherwise the Incident metabolic panel is within normal limits. Ammonia is normal at 18. Troponin is normal at less than 0.03. The CT head scan reveals some atrophy, small vessel ischemic changes, but no acute intracranial  abnormality. X-ray of the left wrist reveals multifocal osteoarthritic changes, no fracture, and no dislocation. X-ray of the chest shows no definite acute cardiopulmonary abnormality. There is some deviation of the trachea toward the left, but it is believed that this is related to old thyroid surgery.  I reviewed these findings with the family in terms which they understand. I have asked them to speak with the nursing facility about increasing fluids. We've also discussed on using a potassium supplement for short period of time. The family will return to the emergency department immediately if any changes or problems. And they will follow-up with Dr. Felecia Shelling, the primary physician for additional evaluation and management.    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

## 2016-12-19 NOTE — ED Notes (Signed)
Barbara Ruiz from [facility] states that she called pts name a few times this morning and it took a while for her to respond.

## 2016-12-20 DIAGNOSIS — N390 Urinary tract infection, site not specified: Secondary | ICD-10-CM | POA: Diagnosis not present

## 2016-12-20 DIAGNOSIS — F0280 Dementia in other diseases classified elsewhere without behavioral disturbance: Secondary | ICD-10-CM | POA: Diagnosis not present

## 2016-12-20 DIAGNOSIS — E785 Hyperlipidemia, unspecified: Secondary | ICD-10-CM | POA: Diagnosis not present

## 2016-12-20 DIAGNOSIS — R4182 Altered mental status, unspecified: Secondary | ICD-10-CM | POA: Diagnosis not present

## 2016-12-20 DIAGNOSIS — G301 Alzheimer's disease with late onset: Secondary | ICD-10-CM | POA: Diagnosis not present

## 2016-12-20 DIAGNOSIS — E876 Hypokalemia: Secondary | ICD-10-CM | POA: Diagnosis not present

## 2016-12-21 DIAGNOSIS — R32 Unspecified urinary incontinence: Secondary | ICD-10-CM | POA: Diagnosis not present

## 2016-12-21 DIAGNOSIS — E785 Hyperlipidemia, unspecified: Secondary | ICD-10-CM | POA: Diagnosis not present

## 2016-12-22 DIAGNOSIS — E785 Hyperlipidemia, unspecified: Secondary | ICD-10-CM | POA: Diagnosis not present

## 2016-12-23 DIAGNOSIS — E785 Hyperlipidemia, unspecified: Secondary | ICD-10-CM | POA: Diagnosis not present

## 2016-12-24 ENCOUNTER — Inpatient Hospital Stay (HOSPITAL_COMMUNITY)
Admission: EM | Admit: 2016-12-24 | Discharge: 2016-12-28 | DRG: 641 | Disposition: A | Discharge status: Home / Self Care | Payer: Medicare HMO | Attending: Internal Medicine | Admitting: Internal Medicine

## 2016-12-24 ENCOUNTER — Encounter (HOSPITAL_COMMUNITY): Payer: Self-pay | Admitting: Emergency Medicine

## 2016-12-24 DIAGNOSIS — R404 Transient alteration of awareness: Secondary | ICD-10-CM | POA: Diagnosis not present

## 2016-12-24 DIAGNOSIS — R531 Weakness: Secondary | ICD-10-CM | POA: Diagnosis not present

## 2016-12-24 DIAGNOSIS — L89151 Pressure ulcer of sacral region, stage 1: Secondary | ICD-10-CM | POA: Diagnosis present

## 2016-12-24 DIAGNOSIS — I10 Essential (primary) hypertension: Secondary | ICD-10-CM | POA: Diagnosis present

## 2016-12-24 DIAGNOSIS — E785 Hyperlipidemia, unspecified: Secondary | ICD-10-CM | POA: Diagnosis not present

## 2016-12-24 DIAGNOSIS — F0391 Unspecified dementia with behavioral disturbance: Secondary | ICD-10-CM

## 2016-12-24 DIAGNOSIS — L03116 Cellulitis of left lower limb: Secondary | ICD-10-CM | POA: Diagnosis present

## 2016-12-24 DIAGNOSIS — Z6833 Body mass index (BMI) 33.0-33.9, adult: Secondary | ICD-10-CM | POA: Diagnosis not present

## 2016-12-24 DIAGNOSIS — L0390 Cellulitis, unspecified: Secondary | ICD-10-CM

## 2016-12-24 DIAGNOSIS — G43909 Migraine, unspecified, not intractable, without status migrainosus: Secondary | ICD-10-CM | POA: Diagnosis present

## 2016-12-24 DIAGNOSIS — Z79899 Other long term (current) drug therapy: Secondary | ICD-10-CM | POA: Diagnosis not present

## 2016-12-24 DIAGNOSIS — E870 Hyperosmolality and hypernatremia: Principal | ICD-10-CM | POA: Diagnosis present

## 2016-12-24 DIAGNOSIS — E860 Dehydration: Secondary | ICD-10-CM | POA: Diagnosis present

## 2016-12-24 DIAGNOSIS — R001 Bradycardia, unspecified: Secondary | ICD-10-CM | POA: Diagnosis present

## 2016-12-24 DIAGNOSIS — F0281 Dementia in other diseases classified elsewhere with behavioral disturbance: Secondary | ICD-10-CM | POA: Diagnosis not present

## 2016-12-24 DIAGNOSIS — R627 Adult failure to thrive: Secondary | ICD-10-CM | POA: Diagnosis not present

## 2016-12-24 DIAGNOSIS — M7989 Other specified soft tissue disorders: Secondary | ICD-10-CM

## 2016-12-24 DIAGNOSIS — E876 Hypokalemia: Secondary | ICD-10-CM

## 2016-12-24 LAB — CBC WITH DIFFERENTIAL/PLATELET
BASOS ABS: 0 10*3/uL (ref 0.0–0.1)
Basophils Relative: 0 %
Eosinophils Absolute: 0 10*3/uL (ref 0.0–0.7)
Eosinophils Relative: 0 %
HEMATOCRIT: 42.3 % (ref 36.0–46.0)
Hemoglobin: 13.7 g/dL (ref 12.0–15.0)
LYMPHS PCT: 13 %
Lymphs Abs: 1.8 10*3/uL (ref 0.7–4.0)
MCH: 30.8 pg (ref 26.0–34.0)
MCHC: 32.4 g/dL (ref 30.0–36.0)
MCV: 95.1 fL (ref 78.0–100.0)
MONO ABS: 1.6 10*3/uL — AB (ref 0.1–1.0)
Monocytes Relative: 12 %
NEUTROS ABS: 10.1 10*3/uL — AB (ref 1.7–7.7)
Neutrophils Relative %: 75 %
Platelets: 367 10*3/uL (ref 150–400)
RBC: 4.45 MIL/uL (ref 3.87–5.11)
RDW: 14.5 % (ref 11.5–15.5)
WBC: 13.5 10*3/uL — ABNORMAL HIGH (ref 4.0–10.5)

## 2016-12-24 LAB — URINALYSIS, ROUTINE W REFLEX MICROSCOPIC
Bilirubin Urine: NEGATIVE
Glucose, UA: NEGATIVE mg/dL
Hgb urine dipstick: NEGATIVE
KETONES UR: 20 mg/dL — AB
Leukocytes, UA: NEGATIVE
Nitrite: NEGATIVE
PROTEIN: 100 mg/dL — AB
Specific Gravity, Urine: 1.026 (ref 1.005–1.030)
pH: 5 (ref 5.0–8.0)

## 2016-12-24 LAB — I-STAT CHEM 8, ED
BUN: 33 mg/dL — ABNORMAL HIGH (ref 6–20)
CHLORIDE: 119 mmol/L — AB (ref 101–111)
CREATININE: 1.1 mg/dL — AB (ref 0.44–1.00)
Calcium, Ion: 1.19 mmol/L (ref 1.15–1.40)
GLUCOSE: 125 mg/dL — AB (ref 65–99)
HCT: 41 % (ref 36.0–46.0)
Hemoglobin: 13.9 g/dL (ref 12.0–15.0)
POTASSIUM: 3.2 mmol/L — AB (ref 3.5–5.1)
Sodium: 159 mmol/L — ABNORMAL HIGH (ref 135–145)
TCO2: 24 mmol/L (ref 0–100)

## 2016-12-24 LAB — CBC
HCT: 38.2 % (ref 36.0–46.0)
Hemoglobin: 12.2 g/dL (ref 12.0–15.0)
MCH: 30.1 pg (ref 26.0–34.0)
MCHC: 31.9 g/dL (ref 30.0–36.0)
MCV: 94.3 fL (ref 78.0–100.0)
PLATELETS: 330 10*3/uL (ref 150–400)
RBC: 4.05 MIL/uL (ref 3.87–5.11)
RDW: 14.4 % (ref 11.5–15.5)
WBC: 12 10*3/uL — ABNORMAL HIGH (ref 4.0–10.5)

## 2016-12-24 LAB — BASIC METABOLIC PANEL
Anion gap: 6 (ref 5–15)
BUN: 29 mg/dL — AB (ref 6–20)
CALCIUM: 8.8 mg/dL — AB (ref 8.9–10.3)
CHLORIDE: 120 mmol/L — AB (ref 101–111)
CO2: 25 mmol/L (ref 22–32)
CREATININE: 0.97 mg/dL (ref 0.44–1.00)
GFR, EST NON AFRICAN AMERICAN: 58 mL/min — AB (ref >60)
Glucose, Bld: 134 mg/dL — ABNORMAL HIGH (ref 65–99)
Potassium: 3.1 mmol/L — ABNORMAL LOW (ref 3.5–5.1)
SODIUM: 151 mmol/L — AB (ref 135–145)

## 2016-12-24 MED ORDER — ONDANSETRON HCL 4 MG PO TABS: 4.0000 mg | ORAL_TABLET | Freq: Four times a day (QID) | ORAL | Status: DC | PRN

## 2016-12-24 MED ORDER — SODIUM CHLORIDE 0.9 % IV BOLUS (SEPSIS)
500.0000 mL | Freq: Once | INTRAVENOUS | Status: AC
  Administered 2016-12-24: 500 mL via INTRAVENOUS

## 2016-12-24 MED ORDER — DIVALPROEX SODIUM 125 MG PO CSDR
375.0000 mg | DELAYED_RELEASE_CAPSULE | Freq: Two times a day (BID) | ORAL | Status: DC
  Administered 2016-12-26: 375 mg via ORAL
  Filled 2016-12-24 (×14): qty 3

## 2016-12-24 MED ORDER — DEXTROSE 5 % IV SOLN
1.0000 g | INTRAVENOUS | Status: DC
  Administered 2016-12-24 – 2016-12-27 (×4): 1 g via INTRAVENOUS
  Filled 2016-12-24 (×7): qty 10

## 2016-12-24 MED ORDER — DONEPEZIL HCL 5 MG PO TABS
10.0000 mg | ORAL_TABLET | Freq: Every day | ORAL | Status: DC
  Administered 2016-12-24 – 2016-12-26 (×2): 10 mg via ORAL
  Filled 2016-12-24 (×2): qty 2

## 2016-12-24 MED ORDER — ENOXAPARIN SODIUM 40 MG/0.4ML ~~LOC~~ SOLN
40.0000 mg | SUBCUTANEOUS | Status: DC
  Administered 2016-12-24 – 2016-12-26 (×2): 40 mg via SUBCUTANEOUS
  Filled 2016-12-24 (×2): qty 0.4

## 2016-12-24 MED ORDER — MEMANTINE HCL 10 MG PO TABS
5.0000 mg | ORAL_TABLET | Freq: Two times a day (BID) | ORAL | Status: DC
  Administered 2016-12-24 – 2016-12-28 (×3): 5 mg via ORAL
  Filled 2016-12-24 (×5): qty 1

## 2016-12-24 MED ORDER — CEFTRIAXONE SODIUM 1 G IJ SOLR
INTRAMUSCULAR | Status: AC
  Filled 2016-12-24: qty 10

## 2016-12-24 MED ORDER — TOPIRAMATE 100 MG PO TABS
100.0000 mg | ORAL_TABLET | Freq: Every day | ORAL | Status: DC
  Filled 2016-12-24 (×3): qty 1

## 2016-12-24 MED ORDER — POTASSIUM CHLORIDE 10 MEQ/100ML IV SOLN
10.0000 meq | INTRAVENOUS | Status: AC
  Administered 2016-12-24 (×3): 10 meq via INTRAVENOUS
  Filled 2016-12-24: qty 100

## 2016-12-24 MED ORDER — DEXTROSE 5 % IV SOLN
INTRAVENOUS | Status: DC
  Administered 2016-12-24 – 2016-12-27 (×3): via INTRAVENOUS

## 2016-12-24 MED ORDER — DIVALPROEX SODIUM 125 MG PO CSDR
DELAYED_RELEASE_CAPSULE | ORAL | Status: AC
  Filled 2016-12-24: qty 3

## 2016-12-24 MED ORDER — ONDANSETRON HCL 4 MG/2ML IJ SOLN: 4.0000 mg | Freq: Four times a day (QID) | INTRAMUSCULAR | Status: DC | PRN

## 2016-12-24 MED ORDER — ACETAMINOPHEN 500 MG PO TABS: 500.0000 mg | ORAL_TABLET | ORAL | Status: DC | PRN

## 2016-12-24 NOTE — ED Notes (Signed)
Pt very beligerent. Drawing back to Careers information officerhit staff, Electrical engineercussing staff. Able to walk around room with steady gait attempting to leave. Unable to orient due to dementia. Refused temp to be taken. Staff at bedside for safety

## 2016-12-24 NOTE — ED Notes (Signed)
Hospitalist at bedside 

## 2016-12-24 NOTE — ED Provider Notes (Signed)
[facility]-EM[facility] DEPT Provider Note   CSN: [id number] Arrival date & time: 12/24/16  1331  By signing my name below, I, Sonum Patel, attest that this documentation has been prepared under the direction and in the presence of Benjiman CoreNathan Corita Allinson, MD. Electronically Signed: Sonum Patel, Neurosurgeoncribe. 12/24/16. 1:49 PM.  History   Chief Complaint Chief Complaint  Patient presents with  . Weakness    The history is provided by the nursing home and the EMS personnel. The history is limited by the condition of the patient. No language interpreter was used.   LEVEL 5 CAVEAT- Altered Mental Status, Dementia  HPI Comments: Verner MouldGeraldine M Scheunemann is a 71 y.o. female brought [facility] by ambulance [facility] who presents to the Emergency Department today with altered mental status and combativeness. Nursing note further states patient has been refusing to eat or drink for the last several days. Patient has a history of dementia.    Past Medical History:  Diagnosis Date  . Alzheimer's dementia   . Bradycardia   . Hypertension   . Migraine   . Thyroid disease     There are no active problems to display for this patient.   Past Surgical History:  Procedure Laterality Date  . ABDOMINAL HYSTERECTOMY    . BACK SURGERY    . CARPAL TUNNEL RELEASE    . thyroid removed      OB History    No data available       Home Medications    Prior to Admission medications   Medication Sig Start Date End Date Taking? Authorizing Provider  acetaminophen (TYLENOL) 500 MG tablet Take 500 mg by mouth every 4 (four) hours as needed.   Yes Historical Provider, MD  alum & mag hydroxide-simeth (MAALOX/MYLANTA) 200-200-20 MG/5ML suspension Take 30 mLs by mouth every 6 (six) hours as needed for indigestion or heartburn.   Yes Historical Provider, MD  amLODipine (NORVASC) 2.5 MG tablet Take 2.5 mg by mouth daily.   Yes Historical Provider, MD  divalproex (DEPAKOTE SPRINKLE) 125 MG capsule Take 375 mg by mouth 2 (two)  times daily.   Yes Historical Provider, MD  donepezil (ARICEPT) 10 MG tablet Take 10 mg by mouth daily. 03/27/16  Yes Historical Provider, MD  guaiFENesin-codeine (ROBITUSSIN AC) 100-10 MG/5ML syrup Take 10 mLs by mouth 3 (three) times daily as needed for cough.   Yes Historical Provider, MD  lisinopril (PRINIVIL,ZESTRIL) 10 MG tablet Take 10 mg by mouth daily.   Yes Historical Provider, MD  loperamide (IMODIUM) 2 MG capsule Take 2 mg by mouth as needed for diarrhea or loose stools.   Yes Historical Provider, MD  memantine (NAMENDA) 5 MG tablet Take 5 mg by mouth 2 (two) times daily. 03/07/16  Yes Historical Provider, MD  nitrofurantoin (MACRODANTIN) 100 MG capsule Take 100 mg by mouth 2 (two) times daily.   Yes Historical Provider, MD  potassium chloride SA (K-DUR,KLOR-CON) 20 MEQ tablet Take 1 tablet (20 mEq total) by mouth once. 12/19/16 12/24/16 Yes Ivery QualeHobson Bryant, PA-C  topiramate (TOPAMAX) 100 MG tablet Take 100 mg by mouth daily. 03/27/16  Yes Historical Provider, MD  traZODone (DESYREL) 150 MG tablet Take 150 mg by mouth at bedtime.   Yes Historical Provider, MD  vitamin E 100 UNIT capsule Take 400 Units by mouth daily.    Yes Historical Provider, MD  magnesium hydroxide (MILK OF MAGNESIA) 400 MG/5ML suspension Take 30 mLs by mouth at bedtime as needed for mild constipation.    Historical Provider,  MD  naproxen (NAPROSYN) 250 MG tablet Take 250 mg by mouth daily.    Historical Provider, MD    Family History No family history on file.  Social History Social History  Substance Use Topics  . Smoking status: Never Smoker  . Smokeless tobacco: Never Used  . Alcohol use No     Allergies   Patient has no known allergies.   Review of Systems Review of Systems  Unable to perform ROS: Dementia     Physical Exam Updated Vital Signs BP 91/58   Pulse 94   Temp 97.8 F (36.6 C) (Axillary)   Resp 18   Ht 5' (1.524 m)   Wt 170 lb (77.1 kg)   SpO2 98%   BMI 33.20 kg/m   Physical Exam    Constitutional: She appears well-developed.  HENT:  Head: Atraumatic.  Tongue is somewhat dry  Eyes: EOM are normal.  Cardiovascular: Normal rate.   Pulmonary/Chest: No respiratory distress.  Abdominal: Soft.  Musculoskeletal: She exhibits no tenderness.  Neurological: She is alert.  Patient's mental status has been decreasing over the last months. She is awake and somewhat agitated.  Skin: Skin is warm. Capillary refill takes less than 2 seconds.     ED Treatments / Results  Labs (all labs ordered are listed, but only abnormal results are displayed) Labs Reviewed  CBC WITH DIFFERENTIAL/PLATELET - Abnormal; Notable for the following:       Result Value   WBC 13.5 (*)    Neutro Abs 10.1 (*)    Monocytes Absolute 1.6 (*)    All other components within normal limits  I-STAT CHEM 8, ED - Abnormal; Notable for the following:    Sodium 159 (*)    Potassium 3.2 (*)    Chloride 119 (*)    BUN 33 (*)    Creatinine, Ser 1.10 (*)    Glucose, Bld 125 (*)    All other components within normal limits    EKG  EKG Interpretation None       Radiology No results found.  Procedures Procedures (including critical care time)  Medications Ordered in ED Medications  sodium chloride 0.9 % bolus 500 mL (500 mLs Intravenous New Bag/Given 12/24/16 1457)     Initial Impression / Assessment and Plan / ED Course  I have reviewed the triage vital signs and the nursing notes.  Pertinent labs & imaging results that were available during my care of the patient were reviewed by me and considered in my medical decision making (see chart for details).     Patient presents with reported decreased oral intake. Discussed with the patient's daughter was with the patient. Patient reportedly has not eaten at around 4 days. She's been getting more combative 2. Per the patient's daughter she has been worsening over the last few months. Seen a week ago for unresponsiveness. Lab work done and showed  hypernatremia likely dehydration. With a sodium of 150 8I think patient would benefit from admission. Admit to internal medicine.  Final Clinical Impressions(s) / ED Diagnoses   Final diagnoses:  Dehydration  Dementia with behavioral disturbance, unspecified dementia type  Hypernatremia    New Prescriptions New Prescriptions   No medications on file   I personally performed the services described in this documentation, which was scribed in my presence. The recorded information has been reviewed and is accurate.      Benjiman Core, MD 12/24/16 [phone]

## 2016-12-24 NOTE — ED Triage Notes (Signed)
[facility] sent from [facility] for generalized weakness, refusing to eat/drink x 4 days. Pt has dementia and is combative. Daughter and staff at facility request pt be seen.

## 2016-12-24 NOTE — ED Notes (Signed)
Pt combative, refusing vital signs. Pacing in room.

## 2016-12-24 NOTE — H&P (Signed)
TRH H&P    Patient Demographics:    Barbara Ruiz, is a 71 y.o. female  MRN: [id number]  DOB - 1946/06/29  Admit Date - 12/24/2016  Referring MD/NP/PA: Dr. Rubin Payor  Outpatient Primary MD for the patient is Avon Gully, MD  Patient coming from: Skilled facility  Chief Complaint  Patient presents with  . Weakness      HPI:    Barbara Ruiz  is a 71 y.o. female, With history of dementia who is currently residing at skilled facility, was brought to the hospital with poor by mouth intake for last 4-5 days. Patient is a very poor historian due to underlying dementia, history obtained from patient's daughter at bedside. Daughter says that she has noticed increased redness and swelling of left lower extremity. Patient denies any pain in the leg.  In the ED patient was found to have hyponatremia with sodium 159, potassium 3.2. WBC 13.5.  There is no history of nausea vomiting diarrhea. No chest pain or shortness of breath. No dysuria. No fever   Review of systems:     Asthma history of present illness, rest of review of systems is unobtainable at this time    With Past History of the following :    Past Medical History:  Diagnosis Date  . Alzheimer's dementia   . Bradycardia   . Hypertension   . Migraine   . Thyroid disease       Past Surgical History:  Procedure Laterality Date  . ABDOMINAL HYSTERECTOMY    . BACK SURGERY    . CARPAL TUNNEL RELEASE    . thyroid removed        Social History:      Social History  Substance Use Topics  . Smoking status: Never Smoker  . Smokeless tobacco: Never Used  . Alcohol use No       Family History :   No pertinent family history   Home Medications:   Prior to Admission medications   Medication Sig Start Date End Date Taking? Authorizing Provider  acetaminophen (TYLENOL) 500 MG tablet Take 500 mg by mouth every 4 (four) hours as  needed.   Yes Historical Provider, MD  alum & mag hydroxide-simeth (MAALOX/MYLANTA) 200-200-20 MG/5ML suspension Take 30 mLs by mouth every 6 (six) hours as needed for indigestion or heartburn.   Yes Historical Provider, MD  amLODipine (NORVASC) 2.5 MG tablet Take 2.5 mg by mouth daily.   Yes Historical Provider, MD  divalproex (DEPAKOTE SPRINKLE) 125 MG capsule Take 375 mg by mouth 2 (two) times daily.   Yes Historical Provider, MD  donepezil (ARICEPT) 10 MG tablet Take 10 mg by mouth daily. 03/27/16  Yes Historical Provider, MD  guaiFENesin-codeine (ROBITUSSIN AC) 100-10 MG/5ML syrup Take 10 mLs by mouth 3 (three) times daily as needed for cough.   Yes Historical Provider, MD  lisinopril (PRINIVIL,ZESTRIL) 10 MG tablet Take 10 mg by mouth daily.   Yes Historical Provider, MD  loperamide (IMODIUM) 2 MG capsule Take 2 mg by mouth as needed for diarrhea or loose stools.  Yes Historical Provider, MD  memantine (NAMENDA) 5 MG tablet Take 5 mg by mouth 2 (two) times daily. 03/07/16  Yes Historical Provider, MD  nitrofurantoin (MACRODANTIN) 100 MG capsule Take 100 mg by mouth 2 (two) times daily.   Yes Historical Provider, MD  potassium chloride SA (K-DUR,KLOR-CON) 20 MEQ tablet Take 1 tablet (20 mEq total) by mouth once. 12/19/16 12/24/16 Yes Ivery QualeHobson Bryant, PA-C  topiramate (TOPAMAX) 100 MG tablet Take 100 mg by mouth daily. 03/27/16  Yes Historical Provider, MD  traZODone (DESYREL) 150 MG tablet Take 150 mg by mouth at bedtime.   Yes Historical Provider, MD  vitamin E 100 UNIT capsule Take 400 Units by mouth daily.    Yes Historical Provider, MD  magnesium hydroxide (MILK OF MAGNESIA) 400 MG/5ML suspension Take 30 mLs by mouth at bedtime as needed for mild constipation.    Historical Provider, MD  naproxen (NAPROSYN) 250 MG tablet Take 250 mg by mouth daily.    Historical Provider, MD     Allergies:    No Known Allergies   Physical Exam:   Vitals  Blood pressure 104/66, pulse (!) 52, temperature 97.8  F (36.6 C), temperature source Axillary, resp. rate 16, height 5' (1.524 m), weight 77.1 kg (170 lb), SpO2 100 %.  1.  General: Appears in no acute distress  2. Psychiatric:  Intact judgement and  insight, awake alert, pleasantly confused  3. Neurologic: No focal neurological deficits, all cranial nerves intact.Strength 5/5 all 4 extremities, sensation intact all 4 extremities, plantars down going.  4. Eyes :  anicteric sclerae, moist conjunctivae with no lid lag. PERRLA.  5. ENMT:  Oropharynx clear with moist mucous membranes and good dentition  6. Neck:  supple, no cervical lymphadenopathy appriciated, No thyromegaly  7. Respiratory : Normal respiratory effort, good air movement bilaterally,clear to  auscultation bilaterally  8. Cardiovascular : RRR, no gallops, rubs or murmurs, no leg edema  9. Gastrointestinal:  Positive bowel sounds, abdomen soft, non-tender to palpation,no hepatosplenomegaly, no rigidity or guarding       10. Skin:  Erythema and swelling noted in the left lower extremity, warm to touch. Nontender to palpation.  11.Musculoskeletal:  Good muscle tone,  joints appear normal , no effusions,  normal range of motion    Data Review:    CBC  Recent Labs Lab 12/19/16 0921 12/24/16 1454 12/24/16 1505  WBC 13.1* 13.5*  --   HGB 12.7 13.7 13.9  HCT 37.8 42.3 41.0  PLT 242 367  --   MCV 91.3 95.1  --   MCH 30.7 30.8  --   MCHC 33.6 32.4  --   RDW 13.9 14.5  --   LYMPHSABS  --  1.8  --   MONOABS  --  1.6*  --   EOSABS  --  0.0  --   BASOSABS  --  0.0  --    ------------------------------------------------------------------------------------------------------------------  Chemistries   Recent Labs Lab 12/19/16 0921 12/24/16 1505  NA 148* 159*  K 2.9* 3.2*  CL 115* 119*  CO2 24  --   GLUCOSE 128* 125*  BUN 17 33*  CREATININE 0.82 1.10*  CALCIUM 9.1  --   AST 47*  --   ALT 22  --   ALKPHOS 40  --   BILITOT 0.6  --     ------------------------------------------------------------------------------------------------------------------  ------------------------------------------------------------------------------------------------------------------ GFR: Estimated Creatinine Clearance: 44.3 mL/min (by C-G formula based on SCr of 1.1 mg/dL (H)). Liver Function Tests:  Recent Labs Lab 12/19/16  0921  AST 47*  ALT 22  ALKPHOS 40  BILITOT 0.6  PROT 6.8  ALBUMIN 2.9*   No results for input(s): LIPASE, AMYLASE in the last 168 hours.  Recent Labs Lab 12/19/16 0921  AMMONIA 18   Coagulation Profile: No results for input(s): INR, PROTIME in the last 168 hours. Cardiac Enzymes:  Recent Labs Lab 12/19/16 0921  TROPONINI <0.03    --------------------------------------------------------------------------------------------------------------- Urine analysis:    Component Value Date/Time   COLORURINE AMBER (A) 12/19/2016 0940   APPEARANCEUR CLEAR 12/19/2016 0940   LABSPEC 1.028 12/19/2016 0940   PHURINE 5.0 12/19/2016 0940   GLUCOSEU NEGATIVE 12/19/2016 0940   HGBUR NEGATIVE 12/19/2016 0940   BILIRUBINUR NEGATIVE 12/19/2016 0940   KETONESUR 20 (A) 12/19/2016 0940   PROTEINUR 30 (A) 12/19/2016 0940   NITRITE NEGATIVE 12/19/2016 0940   LEUKOCYTESUR NEGATIVE 12/19/2016 0940      Imaging Results:    No results found.  My personal review of EKG: Rhythm sinus bradycardia   Assessment & Plan:    Active Problems:   Hypernatremia   Cellulitis   Hypokalemia   1. Hypernatremia- Today sodium is 159, patient's sodium usually remains high due to poor by mouth intake last sodium was 148 on 12/19/2016. Her water deficit is 4.7 L, will gently reduce sodium 8-10 meq over next 24 hours. We'll start D5W at 100 ML per hour. Check BMP at 8 PM tonight. Will follow BMP in a.m. 2. Cellulitis- patient has increased erythema and swelling of left lower extremity, likely from cellulitis. We'll start  ceftriaxone 1 g IV every 24 hours, will check venous duplex of lower extremities to rule out DVT. 3. Hypokalemia- potassium is 3.2, will replace potassium 10 mg IV KCl 3. Check BMP in a.m. 4. Failure to thrive- patient has had poor by mouth intake, likely from cellulitis. Hopefully appetite improved after treating cellulitis. 5. Dementia-no pain or disturbance, at baseline. Continue Aricept, Namenda. 6. History of hypertension- will hold lisinopril and Norvasc at this time as patient blood pressure soft. Can restart  medications when blood pressure is improved.   DVT Prophylaxis-   Lovenox   AM Labs Ordered, also please review Full Orders  Family Communication: Admission, patients condition and plan of care including tests being ordered have been discussed with the patient and  who indicate understanding and agree with the plan and Code Status.  Code Status:  Full code  Admission status: Observation    Time spent in minutes : 60 minutes   Sigfredo Schreier S M.D on 12/24/2016 at 4:27 PM  Between 7am to 7pm - Pager - [phone]. After 7pm go to [email or web address] - password [facility]  [facility] - Office  [phone]

## 2016-12-24 NOTE — ED Notes (Signed)
Left foot noted to be red and toes swollen. EDP notified

## 2016-12-24 NOTE — ED Notes (Signed)
Daughter at bedside. EDP notified

## 2016-12-25 ENCOUNTER — Observation Stay (HOSPITAL_COMMUNITY): Payer: Medicare HMO

## 2016-12-25 DIAGNOSIS — Z79899 Other long term (current) drug therapy: Secondary | ICD-10-CM | POA: Diagnosis not present

## 2016-12-25 DIAGNOSIS — L89151 Pressure ulcer of sacral region, stage 1: Secondary | ICD-10-CM | POA: Diagnosis present

## 2016-12-25 DIAGNOSIS — E870 Hyperosmolality and hypernatremia: Secondary | ICD-10-CM | POA: Diagnosis present

## 2016-12-25 DIAGNOSIS — I10 Essential (primary) hypertension: Secondary | ICD-10-CM | POA: Diagnosis present

## 2016-12-25 DIAGNOSIS — R001 Bradycardia, unspecified: Secondary | ICD-10-CM | POA: Diagnosis present

## 2016-12-25 DIAGNOSIS — Z6833 Body mass index (BMI) 33.0-33.9, adult: Secondary | ICD-10-CM | POA: Diagnosis not present

## 2016-12-25 DIAGNOSIS — F0281 Dementia in other diseases classified elsewhere with behavioral disturbance: Secondary | ICD-10-CM | POA: Diagnosis present

## 2016-12-25 DIAGNOSIS — L03116 Cellulitis of left lower limb: Secondary | ICD-10-CM | POA: Diagnosis present

## 2016-12-25 DIAGNOSIS — R627 Adult failure to thrive: Secondary | ICD-10-CM | POA: Diagnosis present

## 2016-12-25 DIAGNOSIS — G43909 Migraine, unspecified, not intractable, without status migrainosus: Secondary | ICD-10-CM | POA: Diagnosis present

## 2016-12-25 DIAGNOSIS — E785 Hyperlipidemia, unspecified: Secondary | ICD-10-CM | POA: Diagnosis not present

## 2016-12-25 DIAGNOSIS — E860 Dehydration: Secondary | ICD-10-CM | POA: Diagnosis present

## 2016-12-25 DIAGNOSIS — E876 Hypokalemia: Secondary | ICD-10-CM | POA: Diagnosis present

## 2016-12-25 LAB — COMPREHENSIVE METABOLIC PANEL
ALT: 36 U/L (ref 14–54)
ANION GAP: 8 (ref 5–15)
AST: 34 U/L (ref 15–41)
Albumin: 2.8 g/dL — ABNORMAL LOW (ref 3.5–5.0)
Alkaline Phosphatase: 37 U/L — ABNORMAL LOW (ref 38–126)
BUN: 26 mg/dL — ABNORMAL HIGH (ref 6–20)
CHLORIDE: 119 mmol/L — AB (ref 101–111)
CO2: 26 mmol/L (ref 22–32)
CREATININE: 1 mg/dL (ref 0.44–1.00)
Calcium: 8.8 mg/dL — ABNORMAL LOW (ref 8.9–10.3)
GFR, EST NON AFRICAN AMERICAN: 56 mL/min — AB (ref >60)
Glucose, Bld: 136 mg/dL — ABNORMAL HIGH (ref 65–99)
Potassium: 3.4 mmol/L — ABNORMAL LOW (ref 3.5–5.1)
SODIUM: 153 mmol/L — AB (ref 135–145)
Total Bilirubin: 0.3 mg/dL (ref 0.3–1.2)
Total Protein: 5.7 g/dL — ABNORMAL LOW (ref 6.5–8.1)

## 2016-12-25 LAB — CBC
HCT: 37.3 % (ref 36.0–46.0)
Hemoglobin: 12 g/dL (ref 12.0–15.0)
MCH: 30.6 pg (ref 26.0–34.0)
MCHC: 32.2 g/dL (ref 30.0–36.0)
MCV: 95.2 fL (ref 78.0–100.0)
PLATELETS: 321 10*3/uL (ref 150–400)
RBC: 3.92 MIL/uL (ref 3.87–5.11)
RDW: 14.6 % (ref 11.5–15.5)
WBC: 11 10*3/uL — ABNORMAL HIGH (ref 4.0–10.5)

## 2016-12-25 MED ORDER — POTASSIUM CHLORIDE CRYS ER 20 MEQ PO TBCR
40.0000 meq | EXTENDED_RELEASE_TABLET | Freq: Once | ORAL | Status: DC
  Filled 2016-12-25: qty 2

## 2016-12-25 NOTE — Progress Notes (Signed)
Pink foam placed in sacrum area for stage 1.

## 2016-12-25 NOTE — Clinical Social Work Note (Signed)
Clinical Social Work Assessment  Patient Details  Name: Barbara Ruiz MRN: [id number] Date of Birth: 08-Oct-1946  Date of referral:  12/25/16               Reason for consult:  Discharge Planning                Permission sought to share information with:  [facility]cean[facility]ility Contact Representative Permission granted to share information::  Yes, Verbal Permission Granted  Name::        Agency::  [facility]  Relationship::  facility  Contact Information:     Housing/Transportation Living arrangements for the past 2 months:  Assisted Living Facility Source of Information:  Adult Children, Facility Patient Interpreter Needed:  None Criminal Activity/Legal Involvement Pertinent to Current Situation/Hospitalization:  No - Comment as needed Significant Relationships:  Adult Children Lives with:  Facility Resident Do you feel safe going back to the place where you live?  Yes Need for family participation in patient care:  Yes (Comment)  Care giving concerns:  None reported. Pt is long term resident at ALF.    Social Worker assessment / plan:  CSW attempted to meet with pt at bedside. Pt sleeping at time of visit. CSW spoke with pt's daughter, Barbara Ruiz who reports [facility]. She is on the memory care unit. Barbara Ruiz indicates pt is doing well there and requests return. Per [name] at facility, pt requires assist with bathing, dressing, and toileting. She ambulates independently. No home health prior to admission and okay to return. [name] reports that pt has had increased agitation and poor intake x5 days prior to admission.   Employment status:  Retired Database administratornsurance information:  Managed Medicare PT Recommendations:  Not assessed at this time Information / Referral to community resources:  Other (Comment Required) ([facility]  Patient/Family's Response to care:  Pt's [facility].    Patient/Family's Understanding of and Emotional Response to Diagnosis, Current Treatment, and Prognosis:  Pt's daughter is on her way to hospital and will get update on pt.   Emotional Assessment Appearance:  Appears stated age Attitude/Demeanor/Rapport:  Unable to Assess Affect (typically observed):  Unable to Assess Orientation:  Oriented to Self Alcohol / Substance use:  Not Applicable Psych involvement (Current and /or in the community):  No (Comment)  Discharge Needs  Concerns to be addressed:  Discharge Planning Concerns Readmission within the last 30 days:  No Current discharge risk:  Cognitively Impaired Barriers to Discharge:  Continued Medical Work up   [place] Fe[place]ultz, Gerilyn Stargell [place], LCSW 12/25/2016, 9:34 AM [phone]

## 2016-12-25 NOTE — Progress Notes (Signed)
Pt removed IV and refused all meds. Became very combative during cleaning/changing. Pt is refusing new IV placement at this time. Will attempt again after allowing pt to rest and relax. Will continue to monitor.

## 2016-12-25 NOTE — Progress Notes (Signed)
Subjective: This is a 71 years old female admitted due to generalized weakness. Patient is found to have hypernatremia, hypokalemia and lower extremity cellulitis. Patient is on IV antibiotics aad IV fluid. Unable to give history  Objective: Vital signs in last 24 hours: Temp:  [98.1 F (36.7 C)-98.4 F (36.9 C)] 98.4 F (36.9 C) (03/06 0620) Pulse Rate:  [59-70] 68 (03/06 0620) Resp:  [18-20] 20 (03/06 0620) BP: (101-110)/(47-62) 110/62 (03/06 0620) SpO2:  [99 %-100 %] 100 % (03/06 0620) Weight:  [76.5 kg (168 lb 11.2 oz)] 76.5 kg (168 lb 11.2 oz) (03/05 1829) Weight change:     Intake/Output from previous day: No intake/output data recorded.  PHYSICAL EXAM General appearance: delirious and fatigued Resp: clear to auscultation bilaterally Cardio: S1, S2 normal GI: soft, non-tender; bowel sounds normal; no masses,  no organomegaly Extremities: rednesss of the left leg  Lab Results:  Results for orders placed or performed during the hospital encounter of 12/24/16 (from the past 48 hour(s))  CBC with Differential     Status: Abnormal   Collection Time: 12/24/16  2:54 PM  Result Value Ref Range   WBC 13.5 (H) 4.0 - 10.5 K/uL   RBC 4.45 3.87 - 5.11 MIL/uL   Hemoglobin 13.7 12.0 - 15.0 g/dL   HCT 42.3 36.0 - 46.0 %   MCV 95.1 78.0 - 100.0 fL   MCH 30.8 26.0 - 34.0 pg   MCHC 32.4 30.0 - 36.0 g/dL   RDW 14.5 11.5 - 15.5 %   Platelets 367 150 - 400 K/uL   Neutrophils Relative % 75 %   Neutro Abs 10.1 (H) 1.7 - 7.7 K/uL   Lymphocytes Relative 13 %   Lymphs Abs 1.8 0.7 - 4.0 K/uL   Monocytes Relative 12 %   Monocytes Absolute 1.6 (H) 0.1 - 1.0 K/uL   Eosinophils Relative 0 %   Eosinophils Absolute 0.0 0.0 - 0.7 K/uL   Basophils Relative 0 %   Basophils Absolute 0.0 0.0 - 0.1 K/uL  I-stat Chem 8, ED     Status: Abnormal   Collection Time: 12/24/16  3:05 PM  Result Value Ref Range   Sodium 159 (H) 135 - 145 mmol/L   Potassium 3.2 (L) 3.5 - 5.1 mmol/L   Chloride 119 (H)  101 - 111 mmol/L   BUN 33 (H) 6 - 20 mg/dL   Creatinine, Ser 1.10 (H) 0.44 - 1.00 mg/dL   Glucose, Bld 125 (H) 65 - 99 mg/dL   Calcium, Ion 1.19 1.15 - 1.40 mmol/L   TCO2 24 0 - 100 mmol/L   Hemoglobin 13.9 12.0 - 15.0 g/dL   HCT 41.0 36.0 - 46.0 %  Urinalysis, Routine w reflex microscopic     Status: Abnormal   Collection Time: 12/24/16  4:04 PM  Result Value Ref Range   Color, Urine AMBER (A) YELLOW    Comment: BIOCHEMICALS MAY BE AFFECTED BY COLOR   APPearance HAZY (A) CLEAR   Specific Gravity, Urine 1.026 1.005 - 1.030   pH 5.0 5.0 - 8.0   Glucose, UA NEGATIVE NEGATIVE mg/dL   Hgb urine dipstick NEGATIVE NEGATIVE   Bilirubin Urine NEGATIVE NEGATIVE   Ketones, ur 20 (A) NEGATIVE mg/dL   Protein, ur 100 (A) NEGATIVE mg/dL   Nitrite NEGATIVE NEGATIVE   Leukocytes, UA NEGATIVE NEGATIVE   RBC / HPF 0-5 0 - 5 RBC/hpf   WBC, UA 0-5 0 - 5 WBC/hpf   Bacteria, UA RARE (A) NONE SEEN   Mucous  PRESENT    Hyaline Casts, UA PRESENT   CBC     Status: Abnormal   Collection Time: 12/24/16  7:49 PM  Result Value Ref Range   WBC 12.0 (H) 4.0 - 10.5 K/uL   RBC 4.05 3.87 - 5.11 MIL/uL   Hemoglobin 12.2 12.0 - 15.0 g/dL   HCT 38.2 36.0 - 46.0 %   MCV 94.3 78.0 - 100.0 fL   MCH 30.1 26.0 - 34.0 pg   MCHC 31.9 30.0 - 36.0 g/dL   RDW 14.4 11.5 - 15.5 %   Platelets 330 150 - 400 K/uL  Basic metabolic panel     Status: Abnormal   Collection Time: 12/24/16  7:49 PM  Result Value Ref Range   Sodium 151 (H) 135 - 145 mmol/L    Comment: DELTA CHECK NOTED   Potassium 3.1 (L) 3.5 - 5.1 mmol/L   Chloride 120 (H) 101 - 111 mmol/L   CO2 25 22 - 32 mmol/L   Glucose, Bld 134 (H) 65 - 99 mg/dL   BUN 29 (H) 6 - 20 mg/dL   Creatinine, Ser 0.97 0.44 - 1.00 mg/dL   Calcium 8.8 (L) 8.9 - 10.3 mg/dL   GFR calc non Af Amer 58 (L) >60 mL/min   GFR calc Af Amer >60 >60 mL/min    Comment: (NOTE) The eGFR has been calculated using the CKD EPI equation. This calculation has not been validated in all clinical  situations. eGFR's persistently <60 mL/min signify possible Chronic Kidney Disease.    Anion gap 6 5 - 15  CBC     Status: Abnormal   Collection Time: 12/25/16  4:31 AM  Result Value Ref Range   WBC 11.0 (H) 4.0 - 10.5 K/uL   RBC 3.92 3.87 - 5.11 MIL/uL   Hemoglobin 12.0 12.0 - 15.0 g/dL   HCT 37.3 36.0 - 46.0 %   MCV 95.2 78.0 - 100.0 fL   MCH 30.6 26.0 - 34.0 pg   MCHC 32.2 30.0 - 36.0 g/dL   RDW 14.6 11.5 - 15.5 %   Platelets 321 150 - 400 K/uL  Comprehensive metabolic panel     Status: Abnormal   Collection Time: 12/25/16  4:31 AM  Result Value Ref Range   Sodium 153 (H) 135 - 145 mmol/L   Potassium 3.4 (L) 3.5 - 5.1 mmol/L   Chloride 119 (H) 101 - 111 mmol/L   CO2 26 22 - 32 mmol/L   Glucose, Bld 136 (H) 65 - 99 mg/dL   BUN 26 (H) 6 - 20 mg/dL   Creatinine, Ser 1.00 0.44 - 1.00 mg/dL   Calcium 8.8 (L) 8.9 - 10.3 mg/dL   Total Protein 5.7 (L) 6.5 - 8.1 g/dL   Albumin 2.8 (L) 3.5 - 5.0 g/dL   AST 34 15 - 41 U/L   ALT 36 14 - 54 U/L   Alkaline Phosphatase 37 (L) 38 - 126 U/L   Total Bilirubin 0.3 0.3 - 1.2 mg/dL   GFR calc non Af Amer 56 (L) >60 mL/min   GFR calc Af Amer >60 >60 mL/min    Comment: (NOTE) The eGFR has been calculated using the CKD EPI equation. This calculation has not been validated in all clinical situations. eGFR's persistently <60 mL/min signify possible Chronic Kidney Disease.    Anion gap 8 5 - 15    ABGS  Recent Labs  12/24/16 1505  TCO2 24   CULTURES No results found for this or any previous visit (  from the past 240 hour(s)). Studies/Results: No results found.  Medications: I have reviewed the patient's current medications.  Assesment:  Active Problems:   Hypernatremia   Cellulitis   Hypokalemia    Plan:  Medications reviewed Continue D5W IV KCL supplement Continue iv antibiotic Will full ultrasound result    LOS: 0 days   Juda Toepfer 12/25/2016, 5:53 PM

## 2016-12-26 LAB — MRSA PCR SCREENING: MRSA by PCR: NEGATIVE

## 2016-12-26 NOTE — Progress Notes (Signed)
Subjective: Patient is more alert, awake and responding to verbal communication. Her left leg redness and tenderness is improving.  Objective: Vital signs in last 24 hours: Temp:  [97.9 F (36.6 C)] 97.9 F (36.6 C) (03/07 0607) Pulse Rate:  [70] 70 (03/07 0607) Resp:  [18] 18 (03/07 0607) BP: (100)/(63) 100/63 (03/07 0607) SpO2:  [100 %] 100 % (03/07 0607) Weight change:     Intake/Output from previous day: No intake/output data recorded.  PHYSICAL EXAM General appearance: delirious and fatigued Resp: clear to auscultation bilaterally Cardio: S1, S2 normal GI: soft, non-tender; bowel sounds normal; no masses,  no organomegaly Extremities: rednesss of the left leg  Lab Results:  Results for orders placed or performed during the hospital encounter of 12/24/16 (from the past 48 hour(s))  CBC with Differential     Status: Abnormal   Collection Time: 12/24/16  2:54 PM  Result Value Ref Range   WBC 13.5 (H) 4.0 - 10.5 K/uL   RBC 4.45 3.87 - 5.11 MIL/uL   Hemoglobin 13.7 12.0 - 15.0 g/dL   HCT 42.3 36.0 - 46.0 %   MCV 95.1 78.0 - 100.0 fL   MCH 30.8 26.0 - 34.0 pg   MCHC 32.4 30.0 - 36.0 g/dL   RDW 14.5 11.5 - 15.5 %   Platelets 367 150 - 400 K/uL   Neutrophils Relative % 75 %   Neutro Abs 10.1 (H) 1.7 - 7.7 K/uL   Lymphocytes Relative 13 %   Lymphs Abs 1.8 0.7 - 4.0 K/uL   Monocytes Relative 12 %   Monocytes Absolute 1.6 (H) 0.1 - 1.0 K/uL   Eosinophils Relative 0 %   Eosinophils Absolute 0.0 0.0 - 0.7 K/uL   Basophils Relative 0 %   Basophils Absolute 0.0 0.0 - 0.1 K/uL  I-stat Chem 8, ED     Status: Abnormal   Collection Time: 12/24/16  3:05 PM  Result Value Ref Range   Sodium 159 (H) 135 - 145 mmol/L   Potassium 3.2 (L) 3.5 - 5.1 mmol/L   Chloride 119 (H) 101 - 111 mmol/L   BUN 33 (H) 6 - 20 mg/dL   Creatinine, Ser 1.10 (H) 0.44 - 1.00 mg/dL   Glucose, Bld 125 (H) 65 - 99 mg/dL   Calcium, Ion 1.19 1.15 - 1.40 mmol/L   TCO2 24 0 - 100 mmol/L   Hemoglobin 13.9  12.0 - 15.0 g/dL   HCT 41.0 36.0 - 46.0 %  Urinalysis, Routine w reflex microscopic     Status: Abnormal   Collection Time: 12/24/16  4:04 PM  Result Value Ref Range   Color, Urine AMBER (A) YELLOW    Comment: BIOCHEMICALS MAY BE AFFECTED BY COLOR   APPearance HAZY (A) CLEAR   Specific Gravity, Urine 1.026 1.005 - 1.030   pH 5.0 5.0 - 8.0   Glucose, UA NEGATIVE NEGATIVE mg/dL   Hgb urine dipstick NEGATIVE NEGATIVE   Bilirubin Urine NEGATIVE NEGATIVE   Ketones, ur 20 (A) NEGATIVE mg/dL   Protein, ur 100 (A) NEGATIVE mg/dL   Nitrite NEGATIVE NEGATIVE   Leukocytes, UA NEGATIVE NEGATIVE   RBC / HPF 0-5 0 - 5 RBC/hpf   WBC, UA 0-5 0 - 5 WBC/hpf   Bacteria, UA RARE (A) NONE SEEN   Mucous PRESENT    Hyaline Casts, UA PRESENT   CBC     Status: Abnormal   Collection Time: 12/24/16  7:49 PM  Result Value Ref Range   WBC 12.0 (H) 4.0 -  10.5 K/uL   RBC 4.05 3.87 - 5.11 MIL/uL   Hemoglobin 12.2 12.0 - 15.0 g/dL   HCT 38.2 36.0 - 46.0 %   MCV 94.3 78.0 - 100.0 fL   MCH 30.1 26.0 - 34.0 pg   MCHC 31.9 30.0 - 36.0 g/dL   RDW 14.4 11.5 - 15.5 %   Platelets 330 150 - 400 K/uL  Basic metabolic panel     Status: Abnormal   Collection Time: 12/24/16  7:49 PM  Result Value Ref Range   Sodium 151 (H) 135 - 145 mmol/L    Comment: DELTA CHECK NOTED   Potassium 3.1 (L) 3.5 - 5.1 mmol/L   Chloride 120 (H) 101 - 111 mmol/L   CO2 25 22 - 32 mmol/L   Glucose, Bld 134 (H) 65 - 99 mg/dL   BUN 29 (H) 6 - 20 mg/dL   Creatinine, Ser 0.97 0.44 - 1.00 mg/dL   Calcium 8.8 (L) 8.9 - 10.3 mg/dL   GFR calc non Af Amer 58 (L) >60 mL/min   GFR calc Af Amer >60 >60 mL/min    Comment: (NOTE) The eGFR has been calculated using the CKD EPI equation. This calculation has not been validated in all clinical situations. eGFR's persistently <60 mL/min signify possible Chronic Kidney Disease.    Anion gap 6 5 - 15  CBC     Status: Abnormal   Collection Time: 12/25/16  4:31 AM  Result Value Ref Range   WBC 11.0  (H) 4.0 - 10.5 K/uL   RBC 3.92 3.87 - 5.11 MIL/uL   Hemoglobin 12.0 12.0 - 15.0 g/dL   HCT 37.3 36.0 - 46.0 %   MCV 95.2 78.0 - 100.0 fL   MCH 30.6 26.0 - 34.0 pg   MCHC 32.2 30.0 - 36.0 g/dL   RDW 14.6 11.5 - 15.5 %   Platelets 321 150 - 400 K/uL  Comprehensive metabolic panel     Status: Abnormal   Collection Time: 12/25/16  4:31 AM  Result Value Ref Range   Sodium 153 (H) 135 - 145 mmol/L   Potassium 3.4 (L) 3.5 - 5.1 mmol/L   Chloride 119 (H) 101 - 111 mmol/L   CO2 26 22 - 32 mmol/L   Glucose, Bld 136 (H) 65 - 99 mg/dL   BUN 26 (H) 6 - 20 mg/dL   Creatinine, Ser 1.00 0.44 - 1.00 mg/dL   Calcium 8.8 (L) 8.9 - 10.3 mg/dL   Total Protein 5.7 (L) 6.5 - 8.1 g/dL   Albumin 2.8 (L) 3.5 - 5.0 g/dL   AST 34 15 - 41 U/L   ALT 36 14 - 54 U/L   Alkaline Phosphatase 37 (L) 38 - 126 U/L   Total Bilirubin 0.3 0.3 - 1.2 mg/dL   GFR calc non Af Amer 56 (L) >60 mL/min   GFR calc Af Amer >60 >60 mL/min    Comment: (NOTE) The eGFR has been calculated using the CKD EPI equation. This calculation has not been validated in all clinical situations. eGFR's persistently <60 mL/min signify possible Chronic Kidney Disease.    Anion gap 8 5 - 15  MRSA PCR Screening     Status: None   Collection Time: 12/25/16 10:50 PM  Result Value Ref Range   MRSA by PCR NEGATIVE NEGATIVE    Comment:        The GeneXpert MRSA Assay (FDA approved for NASAL specimens only), is one component of a comprehensive MRSA colonization surveillance program. It is  not intended to diagnose MRSA infection nor to guide or monitor treatment for MRSA infections.     ABGS  Recent Labs  12/24/16 1505  TCO2 24   CULTURES Recent Results (from the past 240 hour(s))  MRSA PCR Screening     Status: None   Collection Time: 12/25/16 10:50 PM  Result Value Ref Range Status   MRSA by PCR NEGATIVE NEGATIVE Final    Comment:        The GeneXpert MRSA Assay (FDA approved for NASAL specimens only), is one component of  a comprehensive MRSA colonization surveillance program. It is not intended to diagnose MRSA infection nor to guide or monitor treatment for MRSA infections.    Studies/Results: No results found.  Medications: I have reviewed the patient's current medications.  Assesment:  Active Problems:   Hypernatremia   Cellulitis   Hypokalemia    Plan:  Medications reviewed Continue D5W IV Continue IV antibiotic Will do CBC/BMP in AM   LOS: 1 day   Dorine Duffey 12/26/2016, 8:14 AM

## 2016-12-26 NOTE — Progress Notes (Signed)
Pt remained combative throughout night during cleanings, continues to refuse IV. Informed that pt is more calm and cooperative during day with daughter present, informed oncoming RN that IV may be able to be attempted with daughter present.

## 2016-12-26 NOTE — Care Management Note (Signed)
Case Management Note  Patient Details  Name: Verner MouldGeraldine M Cressey MRN: [id number] Date of Birth: [birth year]  Subjective/Objective:                  Pt admitted with hypernatremia and uti. She is from [facility]. She lives in the dementia unit there. She is ind with ambulation, requires assist with ADL's. No HH pta. Plan is to return to [facility] at DC. CSW working with pt and will make arrangements for return to facility at DC.   Action/Plan: CM will cont to follow for DC planning needs.   Expected Discharge Date:      12/28/2016            Expected Discharge Plan:  Assisted Living / Rest Home  In-House Referral:  Clinical Social Work  Discharge planning Services  CM Consult  Status of Service:  In process, will continue to follow  Malcolm MetroChildress, Jerlean Peralta Demske, RN 12/26/2016, 1:00 PM

## 2016-12-26 NOTE — Progress Notes (Signed)
Patient refused q2 turns. Patient educated.

## 2016-12-27 LAB — BASIC METABOLIC PANEL
Anion gap: 8 (ref 5–15)
BUN: 15 mg/dL (ref 6–20)
CHLORIDE: 116 mmol/L — AB (ref 101–111)
CO2: 25 mmol/L (ref 22–32)
CREATININE: 0.9 mg/dL (ref 0.44–1.00)
Calcium: 8.7 mg/dL — ABNORMAL LOW (ref 8.9–10.3)
GFR calc Af Amer: >60 mL/min (ref >60)
GFR calc non Af Amer: >60 mL/min (ref >60)
Glucose, Bld: 126 mg/dL — ABNORMAL HIGH (ref 65–99)
Potassium: 3.2 mmol/L — ABNORMAL LOW (ref 3.5–5.1)
Sodium: 149 mmol/L — ABNORMAL HIGH (ref 135–145)

## 2016-12-27 LAB — CBC
HCT: 38.1 % (ref 36.0–46.0)
Hemoglobin: 12.4 g/dL (ref 12.0–15.0)
MCH: 30.8 pg (ref 26.0–34.0)
MCHC: 32.5 g/dL (ref 30.0–36.0)
MCV: 94.8 fL (ref 78.0–100.0)
PLATELETS: 252 10*3/uL (ref 150–400)
RBC: 4.02 MIL/uL (ref 3.87–5.11)
RDW: 14.2 % (ref 11.5–15.5)
WBC: 9.2 10*3/uL (ref 4.0–10.5)

## 2016-12-27 NOTE — Progress Notes (Signed)
Subjective: Patient is gradually improving. Her hypernatremia is better. Patient remained confused and disoriented.  Objective: Vital signs in last 24 hours: Temp:  [99.1 F (37.3 C)] 99.1 F (37.3 C) (03/07 2118) Pulse Rate:  [41] 41 (03/07 2118) Resp:  [15] 15 (03/07 2118) BP: (119)/(58) 119/58 (03/07 2118) SpO2:  [100 %] 100 % (03/07 2118) Weight change:  Last BM Date:  (uknown)  Intake/Output from previous day: 03/07 0701 - 03/08 0700 In: 693.8 [I.V.:543.8; IV Piggyback:150] Out: -   PHYSICAL EXAM General appearance: delirious and fatigued Resp: clear to auscultation bilaterally Cardio: S1, S2 normal GI: soft, non-tender; bowel sounds normal; no masses,  no organomegaly Extremities: rednesss of the left leg  Lab Results:  Results for orders placed or performed during the hospital encounter of 12/24/16 (from the past 48 hour(s))  MRSA PCR Screening     Status: None   Collection Time: 12/25/16 10:50 PM  Result Value Ref Range   MRSA by PCR NEGATIVE NEGATIVE    Comment:        The GeneXpert MRSA Assay (FDA approved for NASAL specimens only), is one component of a comprehensive MRSA colonization surveillance program. It is not intended to diagnose MRSA infection nor to guide or monitor treatment for MRSA infections.   Basic metabolic panel     Status: Abnormal   Collection Time: 12/27/16  4:54 AM  Result Value Ref Range   Sodium 149 (H) 135 - 145 mmol/L   Potassium 3.2 (L) 3.5 - 5.1 mmol/L   Chloride 116 (H) 101 - 111 mmol/L   CO2 25 22 - 32 mmol/L   Glucose, Bld 126 (H) 65 - 99 mg/dL   BUN 15 6 - 20 mg/dL   Creatinine, Ser 0.90 0.44 - 1.00 mg/dL   Calcium 8.7 (L) 8.9 - 10.3 mg/dL   GFR calc non Af Amer >60 >60 mL/min   GFR calc Af Amer >60 >60 mL/min    Comment: (NOTE) The eGFR has been calculated using the CKD EPI equation. This calculation has not been validated in all clinical situations. eGFR's persistently <60 mL/min signify possible Chronic  Kidney Disease.    Anion gap 8 5 - 15  CBC     Status: None   Collection Time: 12/27/16  4:54 AM  Result Value Ref Range   WBC 9.2 4.0 - 10.5 K/uL   RBC 4.02 3.87 - 5.11 MIL/uL   Hemoglobin 12.4 12.0 - 15.0 g/dL   HCT 38.1 36.0 - 46.0 %   MCV 94.8 78.0 - 100.0 fL   MCH 30.8 26.0 - 34.0 pg   MCHC 32.5 30.0 - 36.0 g/dL   RDW 14.2 11.5 - 15.5 %   Platelets 252 150 - 400 K/uL    ABGS  Recent Labs  12/24/16 1505  TCO2 24   CULTURES Recent Results (from the past 240 hour(s))  MRSA PCR Screening     Status: None   Collection Time: 12/25/16 10:50 PM  Result Value Ref Range Status   MRSA by PCR NEGATIVE NEGATIVE Final    Comment:        The GeneXpert MRSA Assay (FDA approved for NASAL specimens only), is one component of a comprehensive MRSA colonization surveillance program. It is not intended to diagnose MRSA infection nor to guide or monitor treatment for MRSA infections.    Studies/Results: No results found.  Medications: I have reviewed the patient's current medications.  Assesment:  Active Problems:   Hypernatremia   Cellulitis  Hypokalemia    Plan:  Medications reviewed Continue D5W IV Continue IV antibiotic Will monitor BMP   LOS: 2 days   Takia Runyon 12/27/2016, 8:26 AM

## 2016-12-27 NOTE — Progress Notes (Signed)
PT refusing vital signs and hygiene care after numerous attempts, pt became verbally aggressive with RN Sherrine MaplesGlenn and NT Zenda AlpersWebster. Will try again at a later time.

## 2016-12-27 NOTE — ACP (Advance Care Planning) (Signed)
Brought Advance Directives information to discuss with Ms. Barbara Ruiz. She stated she did not want the information.

## 2016-12-27 NOTE — Progress Notes (Signed)
This writer attempted to give patient her night time medication  while her family was visiting, unsuccessful in taking her medication,  family ( daughter) encouraged patient;however, she was unsuccessful as well.

## 2016-12-27 NOTE — Progress Notes (Signed)
In to see pt and administer AM medicines.  Pt became agitated and aggressive.  Refused meds at this time.

## 2016-12-28 ENCOUNTER — Encounter (HOSPITAL_COMMUNITY): Payer: Self-pay

## 2016-12-28 LAB — BASIC METABOLIC PANEL
Anion gap: 7 (ref 5–15)
BUN: 8 mg/dL (ref 6–20)
CALCIUM: 8.6 mg/dL — AB (ref 8.9–10.3)
CHLORIDE: 108 mmol/L (ref 101–111)
CO2: 26 mmol/L (ref 22–32)
CREATININE: 0.77 mg/dL (ref 0.44–1.00)
GFR calc non Af Amer: >60 mL/min (ref >60)
Glucose, Bld: 136 mg/dL — ABNORMAL HIGH (ref 65–99)
Potassium: 3.3 mmol/L — ABNORMAL LOW (ref 3.5–5.1)
Sodium: 141 mmol/L (ref 135–145)

## 2016-12-28 MED ORDER — POTASSIUM CHLORIDE CRYS ER 20 MEQ PO TBCR
40.0000 meq | EXTENDED_RELEASE_TABLET | Freq: Once | ORAL | Status: AC
  Filled 2016-12-28: qty 2

## 2016-12-28 MED ORDER — AMOXICILLIN-POT CLAVULANATE 500-125 MG PO TABS: 1.0000 | ORAL_TABLET | Freq: Three times a day (TID) | ORAL | 0 refills | Status: AC

## 2016-12-28 NOTE — Discharge Summary (Signed)
Physician Discharge Summary  Patient ID: Barbara MouldGeraldine M Yoon MRN: [id number] DOB/AGE: 11/24/1945 71 y.o. Primary Care Physician:Margan Elias, MD Admit date: 12/24/2016 Discharge date: 12/28/2016    Discharge Diagnoses:   Active Problems:   Hypernatremia   Cellulitis   Hypokalemia dementia hyperrtension  Allergies as of 12/28/2016   No Known Allergies     Medication List    STOP taking these medications   nitrofurantoin 100 MG capsule Commonly known as:  MACRODANTIN     TAKE these medications   acetaminophen 500 MG tablet Commonly known as:  TYLENOL Take 500 mg by mouth every 4 (four) hours as needed.   alum & mag hydroxide-simeth 200-200-20 MG/5ML suspension Commonly known as:  MAALOX/MYLANTA Take 30 mLs by mouth every 6 (six) hours as needed for indigestion or heartburn.   amLODipine 2.5 MG tablet Commonly known as:  NORVASC Take 2.5 mg by mouth daily.   amoxicillin-clavulanate 500-125 MG tablet Commonly known as:  AUGMENTIN Take 1 tablet (500 mg total) by mouth 3 (three) times daily.   divalproex 125 MG capsule Commonly known as:  DEPAKOTE SPRINKLE Take 375 mg by mouth 2 (two) times daily.   donepezil 10 MG tablet Commonly known as:  ARICEPT Take 10 mg by mouth daily.   guaiFENesin-codeine 100-10 MG/5ML syrup Commonly known as:  ROBITUSSIN AC Take 10 mLs by mouth 3 (three) times daily as needed for cough.   lisinopril 10 MG tablet Commonly known as:  PRINIVIL,ZESTRIL Take 10 mg by mouth daily.   loperamide 2 MG capsule Commonly known as:  IMODIUM Take 2 mg by mouth as needed for diarrhea or loose stools.   magnesium hydroxide 400 MG/5ML suspension Commonly known as:  MILK OF MAGNESIA Take 30 mLs by mouth at bedtime as needed for mild constipation.   memantine 5 MG tablet Commonly known as:  NAMENDA Take 5 mg by mouth 2 (two) times daily.   naproxen 250 MG tablet Commonly known as:  NAPROSYN Take 250 mg by mouth daily.   potassium chloride SA  20 MEQ tablet Commonly known as:  K-DUR,KLOR-CON Take 1 tablet (20 mEq total) by mouth once.   topiramate 100 MG tablet Commonly known as:  TOPAMAX Take 100 mg by mouth daily.   traZODone 150 MG tablet Commonly known as:  DESYREL Take 150 mg by mouth at bedtime.   vitamin E 100 UNIT capsule Take 400 Units by mouth daily.       Discharged Condition: improved    Consults: none  Significant Diagnostic Studies: Dg Wrist Complete Left  Result Date: 12/19/2016 CLINICAL DATA:  Pain and redness EXAM: LEFT WRIST - COMPLETE 3+ VIEW COMPARISON:  None. FINDINGS: Frontal, oblique, lateral, and ulnar deviation scaphoid images obtained. No evident fracture or dislocation. There is moderate narrowing of the first carpal -metacarpal joint. There is slightly milder narrowing in the proximal carpal row as well as at the scaphoid trapezial joint. No erosive change or bony destruction. There is calcification in the triangular fibrocartilage region. Pronator quadratus fat pad is not elevated. Bones appear somewhat osteoporotic. IMPRESSION: No fracture or dislocation. No erosive change or bony destruction. 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 Calcification in the triangular fibrocartilage region may indicate chronic tear in this region. Bones appear osteoporotic. Electronically Signed   By: Bretta BangWilliam  Woodruff III M.D.   On: 12/19/2016 11:10   Ct Head Wo Contrast  Result Date: 12/19/2016 CLINICAL DATA:  Altered mental status, unresponsiveness, history of dementia EXAM: CT HEAD WITHOUT  CONTRAST TECHNIQUE: Contiguous axial images were obtained from the base of the skull through the vertex without intravenous contrast. COMPARISON:  CT brain scan of 04/02/2016 FINDINGS: Brain: The ventricular system is slightly more prominent, and the cortical sulci are prominent indicative of progressive diffuse atrophy. The fourth ventricle and basilar cisterns are unremarkable. The  septum is midline in position. Minimal small vessel ischemic changes noted in the periventricular white matter. However no hemorrhage, mass lesion, or acute infarction is seen. Vascular: No vascular abnormality is seen on this unenhanced study. Skull: No calvarial abnormality is seen. Sinuses/Orbits: The paranasal sinuses appear well pneumatized. Other: None. IMPRESSION: Atrophy and minimal small vessel ischemic change. No acute intracranial abnormality. Electronically Signed   By: Dwyane Dee M.D.   On: 12/19/2016 11:20   Dg Chest Portable 1 View  Result Date: 12/19/2016 CLINICAL DATA:  Altered mental status, history of hypertension, nonsmoker. Limited history available. EXAM: PORTABLE CHEST 1 VIEW COMPARISON:  PA and lateral chest x-ray of April 02, 2016 FINDINGS: The lungs are mildly hypoinflated. There is no focal infiltrate or pleural effusion. The heart is top-normal in size. The pulmonary vascularity is normal. There are surgical clips at the base of the neck. 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 The bony thorax exhibits no acute abnormality. There are mild degenerative changes of the shoulders. IMPRESSION: Hypoinflation limits the study. There is no definite acute cardiopulmonary abnormality. There is stable deviation of the trachea toward the left with surgical clips at the base of the neck. History of previous thyroid surgery. Electronically Signed   By: David  [place] M.D.   On: 12/19/2016 08:49    Lab Results: Basic Metabolic Panel:  Recent Labs  60/45/40 0454 12/28/16 0515  NA 149* 141  K 3.2* 3.3*  CL 116* 108  CO2 25 26  GLUCOSE 126* 136*  BUN 15 8  CREATININE 0.90 0.77  CALCIUM 8.7* 8.6*   Liver Function Tests: No results for input(s): AST, ALT, ALKPHOS, BILITOT, PROT, ALBUMIN in the last 72 hours.   CBC:  Recent Labs  12/27/16 0454  WBC 9.2  HGB 12.4  HCT 38.1  MCV 94.8  PLT 252    Recent Results (from the past 240 hour(s))  MRSA PCR  Screening     Status: None   Collection Time: 12/25/16 10:50 PM  Result Value Ref Range Status   MRSA by PCR NEGATIVE NEGATIVE Final    Comment:        The GeneXpert MRSA Assay (FDA approved for NASAL specimens only), is one component of a comprehensive MRSA colonization surveillance program. It is not intended to diagnose MRSA infection nor to guide or monitor treatment for MRSA infections.      Hospital Course:   This is a 71 years old female with  History of multiple medical illnesses was admitted due to hypernatremia, hypokalemia and left leg cellulitis. Her potassium was supplemented and started on IV antibiotics. Her electrolyte was corrected and her cellulitis improved. Patient is back to her baseline. She is going to be discharged in stable condition on oral antibiotics.  Discharge Exam: Blood pressure (!) 119/58, pulse 62, temperature 99.1 F (37.3 C), temperature source Oral, resp. rate 15, height 5\' 3"  (1.6 m), weight 76.5 kg (168 lb 11.2 oz), SpO2 100 %.   Disposition:  Nursing home    Follow-up Information    Tavion Senkbeil, MD Follow up in 2 week(s).   Specialty:  Internal Medicine Contact information: [address] [place] [address] [phone]           Signed: Jarrin Staley   12/28/2016, 8:36 AM

## 2016-12-28 NOTE — Care Management Important Message (Signed)
Important Message  Patient Details  Name: Barbara Ruiz MRN: [id number] Date of Birth: Mar 13, 1946   Medicare Important Message Given:  Yes    Malcolm MetroChildress, Grayce Budden Demske, RN 12/28/2016, 11:02 AM

## 2016-12-28 NOTE — Care Management Note (Signed)
Case Management Note  Patient Details  Name: [name] MRN: [id number] Date of Birth: 06/30/1946   Expected Discharge Date:  12/28/16               Expected Discharge Plan:  Assisted Living / Rest Home  In-House Referral:  Clinical Social Work  Discharge planning Services  CM Consult  Status of Service:  Completed, signed off  Additional Comments: Pt discharging back to ALF today. CSW has made arrangements.   Malcolm Metrohildress, Imer Foxworth Demske, RN 12/28/2016, 11:02 AM

## 2016-12-28 NOTE — Clinical Social Work Note (Signed)
Pt d/c today back to [facility]. Both of pt's daughters notified and agreeable. Facility here now to assess pt and will transport her back to facility. CSW spoke with [facility] where antibiotic order was sent and they will [facility] to [facility] [place] per [facility] request. [facility] will follow up to make sure this was received. They are aware that pt has been refusing to eat or get up unless family is here and indicate this has been going on at facility as well.  Derenda FennelKara Jernard Reiber, LCSW [phone]

## 2016-12-28 NOTE — [place] FL2 (Signed)
[place] MEDICAID FL2 LEVEL OF CARE SCREENING TOOL     IDENTIFICATION  Patient Name: Barbara Ruiz Birthdate: 1946-03-02 Sex: female Admission Date (Current Location): 12/24/2016  [place] and [place] Number:  Roda Shutters [phone] P Facility and Address:  [facility],  [address], [place] [address]      Provider Number: [id number]  Attending Physician Name and Address:  Avon Gully, MD  Relative Name and Phone Number:       Current Level of Care: Hospital Recommended Level of Care: Assisted Living Facility Prior Approval Number:    Date Approved/Denied:   PASRR Number: [id number]  Discharge Plan: Other (Comment) (ALF)    Current Diagnoses: Patient Active Problem List   Diagnosis Date Noted  . Hypernatremia 12/24/2016  . Cellulitis 12/24/2016  . Hypokalemia 12/24/2016    Orientation RESPIRATION BLADDER Height & Weight     Self  Normal Incontinent Weight: 168 lb 11.2 oz (76.5 kg) Height:   (160 cm)  BEHAVIORAL SYMPTOMS/MOOD NEUROLOGICAL BOWEL NUTRITION STATUS     (n/a) Continent Diet (Regular)  AMBULATORY STATUS COMMUNICATION OF NEEDS Skin   Limited Assist Verbally Other (Comment) (Cellulitis left foot. Stage I to sacrum with foam dressing. )                       Personal Care Assistance Level of Assistance  Bathing, Dressing, Feeding Bathing Assistance: Limited assistance Feeding assistance: Limited assistance Dressing Assistance: Limited assistance     Functional Limitations Info  Sight, Hearing, Speech Sight Info: Adequate Hearing Info: Adequate Speech Info: Adequate    SPECIAL CARE FACTORS FREQUENCY                       Contractures      Additional Factors Info  Allergies, Psychotropic Code Status Info: Full code Allergies Info: No known allergies Psychotropic Info: Depakote         Current Medications (12/28/2016):  This is the current hospital active medication list Current Facility-Administered  Medications  Medication Dose Route Frequency Provider Last Rate Last Dose  . acetaminophen (TYLENOL) tablet 500 mg  500 mg Oral Q4H PRN Meredeth Ide, MD      . cefTRIAXone (ROCEPHIN) 1 g in dextrose 5 % 50 mL IVPB  1 g Intravenous Q24H Meredeth Ide, MD   1 g at 12/27/16 1707  . dextrose 5 % solution   Intravenous Continuous Meredeth Ide, MD 75 mL/hr at 12/28/16 0600    . divalproex (DEPAKOTE SPRINKLE) capsule 375 mg  375 mg Oral BID Meredeth Ide, MD   375 mg at 12/26/16 2129  . donepezil (ARICEPT) tablet 10 mg  10 mg Oral QHS Meredeth Ide, MD   10 mg at 12/26/16 2129  . enoxaparin (LOVENOX) injection 40 mg  40 mg Subcutaneous Q24H Meredeth Ide, MD   40 mg at 12/26/16 2128  . memantine (NAMENDA) tablet 5 mg  5 mg Oral BID Meredeth Ide, MD   5 mg at 12/26/16 2129  . ondansetron (ZOFRAN) tablet 4 mg  4 mg Oral Q6H PRN Meredeth Ide, MD       Or  . ondansetron (ZOFRAN) injection 4 mg  4 mg Intravenous Q6H PRN Meredeth Ide, MD      . potassium chloride SA (K-DUR,KLOR-CON) CR tablet 40 mEq  40 mEq Oral Once Avon Gully, MD      . potassium chloride SA (K-DUR,KLOR-CON) CR tablet 40  mEq  40 mEq Oral Once Avon Gully, MD      . topiramate (TOPAMAX) tablet 100 mg  100 mg Oral Daily Meredeth Ide, MD         Discharge Medications: Medication List    STOP taking these medications   nitrofurantoin 100 MG capsule Commonly known as:  MACRODANTIN     TAKE these medications   acetaminophen 500 MG tablet Commonly known as:  TYLENOL Take 500 mg by mouth every 4 (four) hours as needed.   alum & mag hydroxide-simeth 200-200-20 MG/5ML suspension Commonly known as:  MAALOX/MYLANTA Take 30 mLs by mouth every 6 (six) hours as needed for indigestion or heartburn.   amLODipine 2.5 MG tablet Commonly known as:  NORVASC Take 2.5 mg by mouth daily.   amoxicillin-clavulanate 500-125 MG tablet Commonly known as:  AUGMENTIN Take 1 tablet (500 mg total) by mouth 3 (three) times daily.   divalproex  125 MG capsule Commonly known as:  DEPAKOTE SPRINKLE Take 375 mg by mouth 2 (two) times daily.   donepezil 10 MG tablet Commonly known as:  ARICEPT Take 10 mg by mouth daily.   guaiFENesin-codeine 100-10 MG/5ML syrup Commonly known as:  ROBITUSSIN AC Take 10 mLs by mouth 3 (three) times daily as needed for cough.   lisinopril 10 MG tablet Commonly known as:  PRINIVIL,ZESTRIL Take 10 mg by mouth daily.   loperamide 2 MG capsule Commonly known as:  IMODIUM Take 2 mg by mouth as needed for diarrhea or loose stools.   magnesium hydroxide 400 MG/5ML suspension Commonly known as:  MILK OF MAGNESIA Take 30 mLs by mouth at bedtime as needed for mild constipation.   memantine 5 MG tablet Commonly known as:  NAMENDA Take 5 mg by mouth 2 (two) times daily.   naproxen 250 MG tablet Commonly known as:  NAPROSYN Take 250 mg by mouth daily.   potassium chloride SA 20 MEQ tablet Commonly known as:  K-DUR,KLOR-CON Take 1 tablet (20 mEq total) by mouth once.   topiramate 100 MG tablet Commonly known as:  TOPAMAX Take 100 mg by mouth daily.   traZODone 150 MG tablet Commonly known as:  DESYREL Take 150 mg by mouth at bedtime.   vitamin E 100 UNIT capsule Take 400 Units by mouth daily.       Relevant Imaging Results:  Relevant Lab Results:   Additional Information On memory care unit at ALF.   Derenda Fennel [place], [place] [phone]

## 2016-12-28 NOTE — Progress Notes (Signed)
Discharge instruction packet given to transporter, out in stable condition via w/c with staff. 

## 2016-12-31 DIAGNOSIS — E785 Hyperlipidemia, unspecified: Secondary | ICD-10-CM | POA: Diagnosis not present

## 2017-01-01 DIAGNOSIS — E785 Hyperlipidemia, unspecified: Secondary | ICD-10-CM | POA: Diagnosis not present

## 2017-01-02 DIAGNOSIS — E785 Hyperlipidemia, unspecified: Secondary | ICD-10-CM | POA: Diagnosis not present

## 2017-01-03 DIAGNOSIS — F29 Unspecified psychosis not due to a substance or known physiological condition: Secondary | ICD-10-CM | POA: Diagnosis not present

## 2017-01-03 DIAGNOSIS — E785 Hyperlipidemia, unspecified: Secondary | ICD-10-CM | POA: Diagnosis not present

## 2017-01-03 DIAGNOSIS — G301 Alzheimer's disease with late onset: Secondary | ICD-10-CM | POA: Diagnosis not present

## 2017-01-03 DIAGNOSIS — R630 Anorexia: Secondary | ICD-10-CM | POA: Diagnosis not present

## 2017-01-03 DIAGNOSIS — E860 Dehydration: Secondary | ICD-10-CM | POA: Diagnosis not present

## 2017-01-03 DIAGNOSIS — F0281 Dementia in other diseases classified elsewhere with behavioral disturbance: Secondary | ICD-10-CM | POA: Diagnosis not present

## 2017-01-04 DIAGNOSIS — E785 Hyperlipidemia, unspecified: Secondary | ICD-10-CM | POA: Diagnosis not present

## 2017-01-05 DIAGNOSIS — E785 Hyperlipidemia, unspecified: Secondary | ICD-10-CM | POA: Diagnosis not present

## 2017-01-06 DIAGNOSIS — E785 Hyperlipidemia, unspecified: Secondary | ICD-10-CM | POA: Diagnosis not present

## 2017-01-07 DIAGNOSIS — E785 Hyperlipidemia, unspecified: Secondary | ICD-10-CM | POA: Diagnosis not present

## 2017-01-08 DIAGNOSIS — E785 Hyperlipidemia, unspecified: Secondary | ICD-10-CM | POA: Diagnosis not present

## 2017-01-09 DIAGNOSIS — F333 Major depressive disorder, recurrent, severe with psychotic symptoms: Secondary | ICD-10-CM | POA: Diagnosis not present

## 2017-01-09 DIAGNOSIS — G300 Alzheimer's disease with early onset: Secondary | ICD-10-CM | POA: Diagnosis not present

## 2017-01-09 DIAGNOSIS — E785 Hyperlipidemia, unspecified: Secondary | ICD-10-CM | POA: Diagnosis not present

## 2017-01-10 DIAGNOSIS — E785 Hyperlipidemia, unspecified: Secondary | ICD-10-CM | POA: Diagnosis not present

## 2017-01-11 DIAGNOSIS — G300 Alzheimer's disease with early onset: Secondary | ICD-10-CM | POA: Diagnosis not present

## 2017-01-11 DIAGNOSIS — L89322 Pressure ulcer of left buttock, stage 2: Secondary | ICD-10-CM | POA: Diagnosis not present

## 2017-01-11 DIAGNOSIS — Z87440 Personal history of urinary (tract) infections: Secondary | ICD-10-CM | POA: Diagnosis not present

## 2017-01-11 DIAGNOSIS — Z79899 Other long term (current) drug therapy: Secondary | ICD-10-CM | POA: Diagnosis not present

## 2017-01-11 DIAGNOSIS — E785 Hyperlipidemia, unspecified: Secondary | ICD-10-CM | POA: Diagnosis not present

## 2017-01-11 DIAGNOSIS — F0281 Dementia in other diseases classified elsewhere with behavioral disturbance: Secondary | ICD-10-CM | POA: Diagnosis not present

## 2017-01-11 DIAGNOSIS — F333 Major depressive disorder, recurrent, severe with psychotic symptoms: Secondary | ICD-10-CM | POA: Diagnosis not present

## 2017-01-12 ENCOUNTER — Emergency Department (HOSPITAL_COMMUNITY): Payer: Medicare HMO

## 2017-01-12 ENCOUNTER — Encounter (HOSPITAL_COMMUNITY): Payer: Self-pay | Admitting: Emergency Medicine

## 2017-01-12 ENCOUNTER — Observation Stay (HOSPITAL_COMMUNITY)
Admission: EM | Admit: 2017-01-12 | Discharge: 2017-01-20 | Disposition: E | Payer: Medicare HMO | Attending: Family Medicine | Admitting: Family Medicine

## 2017-01-12 DIAGNOSIS — R197 Diarrhea, unspecified: Secondary | ICD-10-CM | POA: Diagnosis not present

## 2017-01-12 DIAGNOSIS — R404 Transient alteration of awareness: Secondary | ICD-10-CM | POA: Diagnosis not present

## 2017-01-12 DIAGNOSIS — A419 Sepsis, unspecified organism: Secondary | ICD-10-CM | POA: Diagnosis not present

## 2017-01-12 DIAGNOSIS — Z515 Encounter for palliative care: Secondary | ICD-10-CM | POA: Diagnosis not present

## 2017-01-12 DIAGNOSIS — Z79899 Other long term (current) drug therapy: Secondary | ICD-10-CM | POA: Diagnosis not present

## 2017-01-12 DIAGNOSIS — E872 Acidosis, unspecified: Secondary | ICD-10-CM

## 2017-01-12 DIAGNOSIS — N179 Acute kidney failure, unspecified: Secondary | ICD-10-CM | POA: Insufficient documentation

## 2017-01-12 DIAGNOSIS — Z8659 Personal history of other mental and behavioral disorders: Secondary | ICD-10-CM | POA: Diagnosis not present

## 2017-01-12 DIAGNOSIS — R031 Nonspecific low blood-pressure reading: Secondary | ICD-10-CM | POA: Diagnosis not present

## 2017-01-12 DIAGNOSIS — I959 Hypotension, unspecified: Principal | ICD-10-CM | POA: Insufficient documentation

## 2017-01-12 DIAGNOSIS — E870 Hyperosmolality and hypernatremia: Secondary | ICD-10-CM | POA: Diagnosis not present

## 2017-01-12 DIAGNOSIS — R4182 Altered mental status, unspecified: Secondary | ICD-10-CM

## 2017-01-12 DIAGNOSIS — F0390 Unspecified dementia without behavioral disturbance: Secondary | ICD-10-CM | POA: Insufficient documentation

## 2017-01-12 LAB — CBC WITH DIFFERENTIAL/PLATELET
BASOS PCT: 0 %
Basophils Absolute: 0 10*3/uL (ref 0.0–0.1)
EOS PCT: 0 %
Eosinophils Absolute: 0 10*3/uL (ref 0.0–0.7)
HCT: 42.1 % (ref 36.0–46.0)
Hemoglobin: 13.4 g/dL (ref 12.0–15.0)
Lymphocytes Relative: 9 %
Lymphs Abs: 1.3 10*3/uL (ref 0.7–4.0)
MCH: 30.5 pg (ref 26.0–34.0)
MCHC: 31.8 g/dL (ref 30.0–36.0)
MCV: 95.7 fL (ref 78.0–100.0)
MONO ABS: 0.8 10*3/uL (ref 0.1–1.0)
Monocytes Relative: 6 %
NEUTROS PCT: 85 %
Neutro Abs: 12 10*3/uL — ABNORMAL HIGH (ref 1.7–7.7)
PLATELETS: 236 10*3/uL (ref 150–400)
RBC: 4.4 MIL/uL (ref 3.87–5.11)
RDW: 15.6 % — ABNORMAL HIGH (ref 11.5–15.5)
WBC: 14.1 10*3/uL — ABNORMAL HIGH (ref 4.0–10.5)

## 2017-01-12 LAB — COMPREHENSIVE METABOLIC PANEL
ALBUMIN: 2.8 g/dL — AB (ref 3.5–5.0)
ALT: 339 U/L — AB (ref 14–54)
AST: 613 U/L — ABNORMAL HIGH (ref 15–41)
Alkaline Phosphatase: 54 U/L (ref 38–126)
Anion gap: 20 — ABNORMAL HIGH (ref 5–15)
BUN: 71 mg/dL — ABNORMAL HIGH (ref 6–20)
CHLORIDE: 129 mmol/L — AB (ref 101–111)
CO2: 18 mmol/L — AB (ref 22–32)
CREATININE: 6.05 mg/dL — AB (ref 0.44–1.00)
Calcium: 8.7 mg/dL — ABNORMAL LOW (ref 8.9–10.3)
GFR calc non Af Amer: 6 mL/min — ABNORMAL LOW (ref >60)
GFR, EST AFRICAN AMERICAN: 7 mL/min — AB (ref >60)
Glucose, Bld: 144 mg/dL — ABNORMAL HIGH (ref 65–99)
Potassium: 3.5 mmol/L (ref 3.5–5.1)
SODIUM: 167 mmol/L — AB (ref 135–145)
Total Bilirubin: 0.6 mg/dL (ref 0.3–1.2)
Total Protein: 6.4 g/dL — ABNORMAL LOW (ref 6.5–8.1)

## 2017-01-12 LAB — I-STAT CG4 LACTIC ACID, ED: Lactic Acid, Venous: 6.58 mmol/L (ref 0.5–1.9)

## 2017-01-12 LAB — CBG MONITORING, ED: GLUCOSE-CAPILLARY: 77 mg/dL (ref 65–99)

## 2017-01-12 LAB — TROPONIN I: TROPONIN I: 0.23 ng/mL — AB (ref <0.03)

## 2017-01-12 MED ORDER — POLYVINYL ALCOHOL 1.4 % OP SOLN
1.0000 [drp] | Freq: Four times a day (QID) | OPHTHALMIC | Status: DC | PRN
  Filled 2017-01-12: qty 15

## 2017-01-12 MED ORDER — MORPHINE SULFATE (CONCENTRATE) 10 MG/0.5ML PO SOLN: 5.0000 mg | ORAL | Status: DC | PRN

## 2017-01-12 MED ORDER — IPRATROPIUM-ALBUTEROL 0.5-2.5 (3) MG/3ML IN SOLN: 3.0000 mL | Freq: Four times a day (QID) | RESPIRATORY_TRACT | Status: DC

## 2017-01-12 MED ORDER — SODIUM CHLORIDE 0.9 % IV BOLUS (SEPSIS)
500.0000 mL | Freq: Once | INTRAVENOUS | Status: AC
  Administered 2017-01-12: 500 mL via INTRAVENOUS

## 2017-01-12 MED ORDER — HALOPERIDOL LACTATE 2 MG/ML PO CONC
0.5000 mg | ORAL | Status: DC | PRN
  Filled 2017-01-12: qty 0.3

## 2017-01-12 MED ORDER — ACETAMINOPHEN 650 MG RE SUPP: 650.0000 mg | Freq: Four times a day (QID) | RECTAL | Status: DC | PRN

## 2017-01-12 MED ORDER — ONDANSETRON 4 MG PO TBDP: 4.0000 mg | ORAL_TABLET | Freq: Four times a day (QID) | ORAL | Status: DC | PRN

## 2017-01-12 MED ORDER — ONDANSETRON HCL 4 MG/2ML IJ SOLN: 4.0000 mg | Freq: Four times a day (QID) | INTRAMUSCULAR | Status: DC | PRN

## 2017-01-12 MED ORDER — MORPHINE SULFATE (PF) 4 MG/ML IV SOLN
4.0000 mg | Freq: Once | INTRAVENOUS | Status: AC
  Administered 2017-01-12: 4 mg via INTRAVENOUS
  Filled 2017-01-12: qty 1

## 2017-01-12 MED ORDER — ORAL CARE MOUTH RINSE: 15.0000 mL | Freq: Two times a day (BID) | OROMUCOSAL | Status: DC

## 2017-01-12 MED ORDER — GLYCOPYRROLATE 1 MG PO TABS
1.0000 mg | ORAL_TABLET | ORAL | Status: DC | PRN
  Filled 2017-01-12: qty 1

## 2017-01-12 MED ORDER — LORAZEPAM 2 MG/ML IJ SOLN
1.0000 mg | INTRAMUSCULAR | Status: DC | PRN
Start: 2017-01-12 — End: 2017-01-13

## 2017-01-12 MED ORDER — LORAZEPAM 2 MG/ML PO CONC: 1.0000 mg | ORAL | Status: DC | PRN

## 2017-01-12 MED ORDER — ACETAMINOPHEN 325 MG PO TABS: 650.0000 mg | ORAL_TABLET | Freq: Four times a day (QID) | ORAL | Status: DC | PRN

## 2017-01-12 MED ORDER — LORAZEPAM 2 MG/ML IJ SOLN
2.0000 mg | Freq: Once | INTRAMUSCULAR | Status: AC
  Administered 2017-01-12: 2 mg via INTRAVENOUS
  Filled 2017-01-12: qty 1

## 2017-01-12 MED ORDER — GLYCOPYRROLATE 0.2 MG/ML IJ SOLN
0.2000 mg | INTRAMUSCULAR | Status: DC | PRN
  Filled 2017-01-12: qty 1

## 2017-01-12 MED ORDER — HALOPERIDOL 0.5 MG PO TABS
0.5000 mg | ORAL_TABLET | ORAL | Status: DC | PRN
  Filled 2017-01-12: qty 1

## 2017-01-12 MED ORDER — BIOTENE DRY MOUTH MT LIQD: 15.0000 mL | OROMUCOSAL | Status: DC | PRN

## 2017-01-12 MED ORDER — LORAZEPAM 1 MG PO TABS: 1.0000 mg | ORAL_TABLET | ORAL | Status: DC | PRN

## 2017-01-12 MED ORDER — HALOPERIDOL LACTATE 5 MG/ML IJ SOLN: 0.5000 mg | INTRAMUSCULAR | Status: DC | PRN

## 2017-01-12 MED ORDER — SODIUM CHLORIDE 0.9 % IV BOLUS (SEPSIS)
1000.0000 mL | Freq: Once | INTRAVENOUS | Status: AC
  Administered 2017-01-12: 1000 mL via INTRAVENOUS

## 2017-01-17 LAB — CULTURE, BLOOD (ROUTINE X 2): Culture: NO GROWTH

## 2017-01-20 NOTE — Progress Notes (Signed)
Unable to obtain BP reading on admission with dynamap or manual BP cuff. Notified Dr. Rinaldo RatelKadolph, stated no need to retry BP readings due to comfort care. Also notified her of cardiac monitoring and continuous pulse ox orders showing as active. Order received to d/c both orders since comfort care in place. Family at bedside with patient. Earnstine RegalAshley Maddie Brazier, RN

## 2017-01-20 NOTE — H&P (Signed)
History and Physical    Barbara Ruiz WNU:[id number] DOB: 07/26/1946 DOA: 11-25-16  PCP: Avon GullyFANTA,TESFAYE, MD  Patient coming from: SNF  Chief Complaint: Diarrhea, unresponsive  HPI: Barbara Ruiz is a 71 y.o. female with medical history significant of Alzheimer's Dementia, HTN and recent hospitalization for dehydration with a sodium of 160 was brought in by EMS after having an episode of diarrhea and being unresponsive at [facility].  Unfortunately, limited history was provided by facility.  Per patient's daughters, who are bedside, patient was recently hospitalized a few weeks ago and seemed to be improving.  She wasn't eating much but was discharged and, to their knowledge, was still eating and taking her medication.  One of her daughters received a call today to state patient fell out of bed and had an episode of diarrhea.  No other history obtained.  ED Course: Patient arrived to ED via EMS- blood pressure was 50s systolic.  Due to poor peripheral options labs were obtained via femoral vein by EDP.  Patients daughters discussed with EDP not to pursue invasive options and opted for comfort measures only.  [facility] was asked to admit for comfort care/ hospice.  Review of Systems: Unable to elicit as patient is unresponsive   Past Medical History:  Diagnosis Date  . Alzheimer's dementia   . Bradycardia   . Hypertension   . Migraine   . Thyroid disease     Past Surgical History:  Procedure Laterality Date  . ABDOMINAL HYSTERECTOMY    . BACK SURGERY    . CARPAL TUNNEL RELEASE    . thyroid removed       reports that she has never smoked. She has never used smokeless tobacco. She reports that she does not drink alcohol or use drugs.- per previous history  No Known Allergies  No family history on file.   Prior to Admission medications   Medication Sig Start Date End Date Taking? Authorizing Provider  acetaminophen (TYLENOL) 500 MG tablet Take 500 mg by mouth every 4  (four) hours as needed.   Yes Historical Provider, MD  alum & mag hydroxide-simeth (MAALOX/MYLANTA) 200-200-20 MG/5ML suspension Take 30 mLs by mouth every 6 (six) hours as needed for indigestion or heartburn.   Yes Historical Provider, MD  amLODipine (NORVASC) 2.5 MG tablet Take 2.5 mg by mouth daily.   Yes Historical Provider, MD  divalproex (DEPAKOTE SPRINKLE) 125 MG capsule Take 375 mg by mouth 2 (two) times daily.   Yes Historical Provider, MD  donepezil (ARICEPT) 10 MG tablet Take 10 mg by mouth daily. 03/27/16  Yes Historical Provider, MD  guaiFENesin-codeine (ROBITUSSIN AC) 100-10 MG/5ML syrup Take 10 mLs by mouth 3 (three) times daily as needed for cough.   Yes Historical Provider, MD  lisinopril (PRINIVIL,ZESTRIL) 10 MG tablet Take 10 mg by mouth daily.   Yes Historical Provider, MD  magnesium hydroxide (MILK OF MAGNESIA) 400 MG/5ML suspension Take 30 mLs by mouth at bedtime as needed for mild constipation.   Yes Historical Provider, MD  memantine (NAMENDA) 5 MG tablet Take 5 mg by mouth 2 (two) times daily. 03/07/16  Yes Historical Provider, MD  naproxen (NAPROSYN) 250 MG tablet Take 250 mg by mouth daily.   Yes Historical Provider, MD  ondansetron (ZOFRAN-ODT) 8 MG disintegrating tablet Take 8 mg by mouth 3 (three) times daily as needed for nausea or vomiting.   Yes Historical Provider, MD  potassium chloride SA (K-DUR,KLOR-CON) 20 MEQ tablet Take 1 tablet (20 mEq total) by  mouth once. 12/19/16 02/10/2017 Yes Ivery Quale, PA-C  sertraline (ZOLOFT) 20 MG/ML concentrated solution Take 40 mg by mouth daily.   Yes Historical Provider, MD  topiramate (TOPAMAX) 100 MG tablet Take 100 mg by mouth daily. 03/27/16  Yes Historical Provider, MD  traZODone (DESYREL) 150 MG tablet Take 150 mg by mouth at bedtime.   Yes Historical Provider, MD  vitamin E 100 UNIT capsule Take 400 Units by mouth daily.    Yes Historical Provider, MD  amoxicillin-clavulanate (AUGMENTIN) 500-125 MG tablet Take 1 tablet (500 mg  total) by mouth 3 (three) times daily. Patient not taking: Reported on 02/10/2017 12/28/16   Avon Gully, MD    Physical Exam: Vitals:   10-Feb-2017 1400 02/10/2017 1415 02/10/2017 1445 02/10/17 1515  BP:      Pulse:      Resp: (!) 25 (!) 23 (!) 27 (!) 28  Temp:      TempSrc:      SpO2:      Weight:      Height:          Constitutional: calm, comfortable Vitals:   10-Feb-2017 1400 February 10, 2017 1415 02-10-17 1445 2017-02-10 1515  BP:      Pulse:      Resp: (!) 25 (!) 23 (!) 27 (!) 28  Temp:      TempSrc:      SpO2:      Weight:      Height:       Eyes: closed ENMT: Mucous membranes are extremely dry Neck: normal, supple, no masses, no thyromegaly Respiratory: tachypnea, shallow respirations Cardiovascular: Regular rate and rhythm, no murmurs / rubs / gallops. No extremity edema. Cool extremities with >2 second capillary refill Abdomen: no tenderness, no masses palpated. Soft bowel sounds.  Musculoskeletal: no clubbing. No joint deformity upper and lower extremities.   Skin: no rashes, lesions, ulcers. No induration Neurologic: patient unresponsive Psychiatric: patient unresponsive   Labs on Admission: I have personally reviewed following labs and imaging studies  CBC:  Recent Labs Lab 02-10-17 1248  WBC 14.1*  NEUTROABS 12.0*  HGB 13.4  HCT 42.1  MCV 95.7  PLT 236   Basic Metabolic Panel:  Recent Labs Lab Feb 10, 2017 1248  NA 167*  K 3.5  CL 129*  CO2 18*  GLUCOSE 144*  BUN 71*  CREATININE 6.05*  CALCIUM 8.7*   GFR: Estimated Creatinine Clearance: 8.6 mL/min (A) (by C-G formula based on SCr of 6.05 mg/dL (H)). Liver Function Tests:  Recent Labs Lab 02/10/17 1248  AST 613*  ALT 339*  ALKPHOS 54  BILITOT 0.6  PROT 6.4*  ALBUMIN 2.8*   No results for input(s): LIPASE, AMYLASE in the last 168 hours. No results for input(s): AMMONIA in the last 168 hours. Coagulation Profile: No results for input(s): INR, PROTIME in the last 168 hours. Cardiac  Enzymes:  Recent Labs Lab 02-10-17 1248  TROPONINI 0.23*   BNP (last 3 results) No results for input(s): PROBNP in the last 8760 hours. HbA1C: No results for input(s): HGBA1C in the last 72 hours. CBG:  Recent Labs Lab 02-10-2017 1250  GLUCAP 77   Lipid Profile: No results for input(s): CHOL, HDL, LDLCALC, TRIG, CHOLHDL, LDLDIRECT in the last 72 hours. Thyroid Function Tests: No results for input(s): TSH, T4TOTAL, FREET4, T3FREE, THYROIDAB in the last 72 hours. Anemia Panel: No results for input(s): VITAMINB12, FOLATE, FERRITIN, TIBC, IRON, RETICCTPCT in the last 72 hours. Urine analysis:    Component Value Date/Time   COLORURINE  AMBER (A) 12/24/2016 1604   APPEARANCEUR HAZY (A) 12/24/2016 1604   LABSPEC 1.026 12/24/2016 1604   PHURINE 5.0 12/24/2016 1604   GLUCOSEU NEGATIVE 12/24/2016 1604   HGBUR NEGATIVE 12/24/2016 1604   BILIRUBINUR NEGATIVE 12/24/2016 1604   KETONESUR 20 (A) 12/24/2016 1604   PROTEINUR 100 (A) 12/24/2016 1604   NITRITE NEGATIVE 12/24/2016 1604   LEUKOCYTESUR NEGATIVE 12/24/2016 1604   Sepsis Labs: !!!!!!!!!!!!!!!!!!!!!!!!!!!!!!!!!!!!!!!!!!!! @LABRCNTIP (procalcitonin:4,lacticidven:4) ) Recent Results (from the past 240 hour(s))  Blood Culture (routine x 2)     Status: None (Preliminary result)   Collection Time: 22-Jan-2017 12:49 PM  Result Value Ref Range Status   Specimen Description BLOOD FEMORAL STICK  Final   Special Requests BOTTLES DRAWN AEROBIC AND ANAEROBIC [facility] EACH  Final   Culture PENDING  Incomplete   Report Status PENDING  Incomplete     Radiological Exams on Admission: Dg Chest Port 1 View  Result Date: January 22, 2017 CLINICAL DATA:  Altered mental status EXAM: PORTABLE CHEST 1 VIEW COMPARISON:  December 19, 2016 FINDINGS: No edema or consolidation. Heart size and pulmonary vascularity are normal. No adenopathy. There are surgical clips in the left peritracheal region. No pneumothorax. IMPRESSION: No edema or consolidation.  Electronically Signed   By: Bretta Bang III M.D.   On: Jan 22, 2017 12:58    EKG: Independently reviewed. Sinus rhythmn  Assessment/Plan Principal Problem:   Hospice care patient Active Problems:   Sepsis (HCC)     Sepsis - family requesting comfort measures only- no intubation, no vasopressors, no antibiotics, no further blood draws, medications for comfort only - hospice admission - anticipate hospital death - multiorgan system failure - unclear source - morphine SL for pain, anxiety, tachypnea - SW consultation - chaplain consultation - obs admit to med surg - if patient lives thru the night would be appropriate for residential hospice   DVT prophylaxis: None- comfort care only Code Status: DNR- discussed with two daughters bedside Family Communication:  Two daughters are bedside  Disposition Plan: if patient survives the night could possibly discharge to residential hospice Consults called: No Admission status:  Obs- medical floor   Katrinka Blazing MD [facility] Pager [phone]  If 7PM-7AM, please contact night-coverage [email or web address] Password [facility]  01-22-17, 3:29 PM

## 2017-01-20 NOTE — ED Provider Notes (Signed)
[facility]-EMERGENCY DEPT Provider Note   CSN: [id number] Arrival date & time: 01/18/17  1210  By signing my name below, I, Majel Homer, attest that this documentation has been prepared under the direction and in the presence of Jacalyn Lefevre, MD . Electronically Signed: Majel Homer, Scribe. 01/18/2017. 12:23 PM.  History   Chief Complaint Chief Complaint  Patient presents with  . Altered Mental Status   The history is provided by the EMS personnel. No language interpreter was used.   LEVEL V CAVEAT: HPI and ROS limited due to ALtered Mental Status  HPI Comments: Barbara Ruiz is a 71 y.o. female with PMHx of Alzheimer's Disease, HTN, and bradycardia, brought in by EMS to the Emergency Department from [facility] for an evaluation of altered mental status. Per nursing staff at her facility, pt was last seen normal last night. EMS reports pt has experienced constant diarrhea for the "past several days."   Pt admitted to the hospital a few weeks ago for severe dehydration and electrolyte abnormalities.  Past Medical History:  Diagnosis Date  . Alzheimer's dementia   . Bradycardia   . Hypertension   . Migraine   . Thyroid disease     Patient Active Problem List   Diagnosis Date Noted  . Sepsis (HCC) 01-18-17  . Hypernatremia 12/24/2016  . Cellulitis 12/24/2016  . Hypokalemia 12/24/2016    Past Surgical History:  Procedure Laterality Date  . ABDOMINAL HYSTERECTOMY    . BACK SURGERY    . CARPAL TUNNEL RELEASE    . thyroid removed      OB History    No data available       Home Medications    Prior to Admission medications   Medication Sig Start Date End Date Taking? Authorizing Provider  acetaminophen (TYLENOL) 500 MG tablet Take 500 mg by mouth every 4 (four) hours as needed.   Yes Historical Provider, MD  alum & mag hydroxide-simeth (MAALOX/MYLANTA) 200-200-20 MG/5ML suspension Take 30 mLs by mouth every 6 (six) hours as needed for indigestion or  heartburn.   Yes Historical Provider, MD  amLODipine (NORVASC) 2.5 MG tablet Take 2.5 mg by mouth daily.   Yes Historical Provider, MD  divalproex (DEPAKOTE SPRINKLE) 125 MG capsule Take 375 mg by mouth 2 (two) times daily.   Yes Historical Provider, MD  donepezil (ARICEPT) 10 MG tablet Take 10 mg by mouth daily. 03/27/16  Yes Historical Provider, MD  guaiFENesin-codeine (ROBITUSSIN AC) 100-10 MG/5ML syrup Take 10 mLs by mouth 3 (three) times daily as needed for cough.   Yes Historical Provider, MD  lisinopril (PRINIVIL,ZESTRIL) 10 MG tablet Take 10 mg by mouth daily.   Yes Historical Provider, MD  magnesium hydroxide (MILK OF MAGNESIA) 400 MG/5ML suspension Take 30 mLs by mouth at bedtime as needed for mild constipation.   Yes Historical Provider, MD  memantine (NAMENDA) 5 MG tablet Take 5 mg by mouth 2 (two) times daily. 03/07/16  Yes Historical Provider, MD  naproxen (NAPROSYN) 250 MG tablet Take 250 mg by mouth daily.   Yes Historical Provider, MD  ondansetron (ZOFRAN-ODT) 8 MG disintegrating tablet Take 8 mg by mouth 3 (three) times daily as needed for nausea or vomiting.   Yes Historical Provider, MD  potassium chloride SA (K-DUR,KLOR-CON) 20 MEQ tablet Take 1 tablet (20 mEq total) by mouth once. 12/19/16 01/18/2017 Yes Ivery Quale, PA-C  sertraline (ZOLOFT) 20 MG/ML concentrated solution Take 40 mg by mouth daily.   Yes Historical Provider, MD  topiramate (TOPAMAX) 100 MG tablet Take 100 mg by mouth daily. 03/27/16  Yes Historical Provider, MD  traZODone (DESYREL) 150 MG tablet Take 150 mg by mouth at bedtime.   Yes Historical Provider, MD  vitamin E 100 UNIT capsule Take 400 Units by mouth daily.    Yes Historical Provider, MD  amoxicillin-clavulanate (AUGMENTIN) 500-125 MG tablet Take 1 tablet (500 mg total) by mouth 3 (three) times daily. Patient not taking: Reported on 06-23-17 12/28/16   Avon Gullyesfaye Fanta, MD    Family History No family history on file.  Social History Social History    Substance Use Topics  . Smoking status: Never Smoker  . Smokeless tobacco: Never Used  . Alcohol use No   Allergies   Patient has no known allergies.  Review of Systems Review of Systems  Unable to perform ROS: Mental status change   Physical Exam Updated Vital Signs BP (!) 76/47 (BP Location: Left Arm)   Pulse (!) 25   Temp 99.4 F (37.4 C) (Rectal)   Resp (!) 27   Ht 5\' 3"  (1.6 m)   Wt 168 lb (76.2 kg)   SpO2 94%   BMI 29.76 kg/m   Physical Exam  Constitutional: She appears well-developed. She appears lethargic. She has a sickly appearance. She appears ill. She appears distressed.  HENT:  Head: Normocephalic and atraumatic.  Mouth/Throat: Mucous membranes are dry.  Eyes: EOM are normal.  Neck: Normal range of motion.  Cardiovascular: Normal rate and regular rhythm.   Pulmonary/Chest:  Rhonchi and rales   Abdominal: Soft. She exhibits no distension. There is no tenderness.  Musculoskeletal: Normal range of motion.  Neurological: She appears lethargic.  Responsive to painful stimuli only  Pt is moving both arms with painful stimuli, but unable to get a better neurologic exam due to ms  Skin: Skin is warm and dry.  Nursing note and vitals reviewed.  ED Treatments / Results  DIAGNOSTIC STUDIES:        Labs (all labs ordered are listed, but only abnormal results are displayed) Labs Reviewed  COMPREHENSIVE METABOLIC PANEL - Abnormal; Notable for the following:       Result Value   Sodium 167 (*)    Chloride 129 (*)    CO2 18 (*)    Glucose, Bld 144 (*)    BUN 71 (*)    Creatinine, Ser 6.05 (*)    Calcium 8.7 (*)    Total Protein 6.4 (*)    Albumin 2.8 (*)    AST 613 (*)    ALT 339 (*)    GFR calc non Af Amer 6 (*)    GFR calc Af Amer 7 (*)    Anion gap 20 (*)    All other components within normal limits  CBC WITH DIFFERENTIAL/PLATELET - Abnormal; Notable for the following:    WBC 14.1 (*)    RDW 15.6 (*)    Neutro Abs 12.0 (*)    All other  components within normal limits  TROPONIN I - Abnormal; Notable for the following:    Troponin I 0.23 (*)    All other components within normal limits  I-STAT CG4 LACTIC ACID, ED - Abnormal; Notable for the following:    Lactic Acid, Venous 6.58 (*)    All other components within normal limits  CULTURE, BLOOD (ROUTINE X 2)  CULTURE, BLOOD (ROUTINE X 2)  URINE CULTURE  URINALYSIS, ROUTINE W REFLEX MICROSCOPIC  BLOOD GAS, ARTERIAL  CBG MONITORING, ED  I-STAT  CG4 LACTIC ACID, ED    EKG  EKG Interpretation  Date/Time:  Saturday 02/04/17 12:43:03 EDT Ventricular Rate:  89 PR Interval:    QRS Duration: 88 QT Interval:  411 QTC Calculation: 501 R Axis:   68 Text Interpretation:  Sinus rhythm Atrial premature complex Prolonged QT interval No significant change since last tracing Confirmed by [facility] MD, Nile Dorning ([id number]) on February 04, 2017 12:45:31 PM       Radiology Dg Chest Port 1 View  Result Date: 02-04-17 CLINICAL DATA:  Altered mental status EXAM: PORTABLE CHEST 1 VIEW COMPARISON:  December 19, 2016 FINDINGS: No edema or consolidation. Heart size and pulmonary vascularity are normal. No adenopathy. There are surgical clips in the left peritracheal region. No pneumothorax. IMPRESSION: No edema or consolidation. Electronically Signed   By: Bretta Bang III M.D.   On: 04-Feb-2017 12:58    Procedures Aspiration of blood/fluid Date/Time: Feb 04, 2017 1:32 PM Performed by: Jacalyn Lefevre Authorized by: Jacalyn Lefevre  Consent: The procedure was performed in an emergent situation. Patient identity confirmed: arm band Time out: Immediately prior to procedure a "time out" was called to verify the correct patient, procedure, equipment, support staff and site/side marked as required. Preparation: Patient was prepped and draped in the usual sterile fashion. Local anesthesia used: no  Anesthesia: Local anesthesia used: no Patient tolerance: Patient tolerated the procedure well  with no immediate complications Comments: Blood unable to be drawn peripherally, so a right femoral vein stick done to obtain blood.      (including critical care time)  Medications Ordered in ED Medications  acetaminophen (TYLENOL) tablet 650 mg (not administered)    Or  acetaminophen (TYLENOL) suppository 650 mg (not administered)  haloperidol (HALDOL) tablet 0.5 mg (not administered)    Or  haloperidol (HALDOL) 2 MG/ML solution 0.5 mg (not administered)    Or  haloperidol lactate (HALDOL) injection 0.5 mg (not administered)  ondansetron (ZOFRAN-ODT) disintegrating tablet 4 mg (not administered)    Or  ondansetron (ZOFRAN) injection 4 mg (not administered)  glycopyrrolate (ROBINUL) tablet 1 mg (not administered)    Or  glycopyrrolate (ROBINUL) injection 0.2 mg (not administered)    Or  glycopyrrolate (ROBINUL) injection 0.2 mg (not administered)  antiseptic oral rinse (BIOTENE) solution 15 mL (not administered)  polyvinyl alcohol (LIQUIFILM TEARS) 1.4 % ophthalmic solution 1 drop (not administered)  morphine CONCENTRATE 10 MG/0.5ML oral solution 5 mg (not administered)    Or  morphine CONCENTRATE 10 MG/0.5ML oral solution 5 mg (not administered)  LORazepam (ATIVAN) tablet 1 mg (not administered)    Or  LORazepam (ATIVAN) 2 MG/ML concentrated solution 1 mg (not administered)    Or  LORazepam (ATIVAN) injection 1 mg (not administered)  sodium chloride 0.9 % bolus 1,000 mL (0 mLs Intravenous Stopped 02-04-17 1338)    And  sodium chloride 0.9 % bolus 1,000 mL (0 mLs Intravenous Stopped 02-04-2017 1339)    And  sodium chloride 0.9 % bolus 500 mL (0 mLs Intravenous Stopped Feb 04, 2017 1319)  morphine 4 MG/ML injection 4 mg (4 mg Intravenous Given 2017-02-04 1419)  LORazepam (ATIVAN) injection 2 mg (2 mg Intravenous Given 2017-02-04 1419)    Initial Impression / Assessment and Plan / ED Course  I have reviewed the triage vital signs and the nursing notes.  Pt is critically ill.  Pt's 2  daughters are here.  Pt had been a full code, but due to the near unresponsive state, her bp low, and her not protecting her airway, they requested  comfort care measures only.  They understand that pt will likely die.  They do not think pt would want intubation and central line/aggressive measures.  I agree that this is a good plan.  Pertinent labs & imaging results that were available during my care of the patient were reviewed by me and considered in my medical decision making (see chart for details).    I personally performed the services described in this documentation, which was scribed in my presence. The recorded information has been reviewed and is accurate.  CRITICAL CARE Performed by: Jacalyn Lefevre   Total critical care time: 30 minutes  Critical care time was exclusive of separately billable procedures and treating other patients.  Critical care was necessary to treat or prevent imminent or life-threatening deterioration.  Critical care was time spent personally by me on the following activities: development of treatment plan with patient and/or surrogate as well as nursing, discussions with consultants, evaluation of patient's response to treatment, examination of patient, obtaining history from patient or surrogate, ordering and performing treatments and interventions, ordering and review of laboratory studies, ordering and review of radiographic studies, pulse oximetry and re-evaluation of patient's condition.  Pt d/w hospitalist (Dr. Rinaldo Ratel) for admission for comfort care as she has still not passed.  I anticipate that it won't be long, but upstairs may be more comfortable for the family as well.  Final Clinical Impressions(s) / ED Diagnoses   Final diagnoses:  Altered mental status, unspecified altered mental status type  Hypotension, unspecified hypotension type  Acute renal failure, unspecified acute renal failure type (HCC)  Hypernatremia  Lactic acidosis  Diarrhea,  unspecified type  History of dementia  Palliative care status    New Prescriptions New Prescriptions   No medications on file     Jacalyn Lefevre, MD 01-19-17 [phone]

## 2017-01-20 NOTE — ED Notes (Signed)
Pt stable and ready for transport to [facility]. Report given to Desma PaganiniAshley Russell, RN.

## 2017-01-20 NOTE — ED Notes (Signed)
Paged hospitalist

## 2017-01-20 NOTE — ED Notes (Signed)
Pt needs consult for spiritual care.  Accidentally completed by Diplomatic Services operational officersecretary.

## 2017-01-20 NOTE — Progress Notes (Signed)
Called to patient's room by children at bedside. Patient not breathing, no apical pulse. Time of death [id number]. Pronounced by Hildred AlaminAshley Licia Harl,RN and Dagoberto LigasJessica Mays, RN. Children were present and bedside and aware. Text-paged Dr. Rinaldo RatelKadolph to notify. Nursing supervisor aware. [facility] notified at [phone]. Spoke with Bradly Bienenstockanielle Monroe, referral number given [phone]. Stated patient is ruled out for all donor services. Family with patient at this time, awaiting family notification of funeral home preference. Earnstine RegalAshley Teri Diltz, RN

## 2017-01-20 NOTE — ED Triage Notes (Signed)
Pt from [facility]. Per EMS nursing home staff states pt altered since last night. Patient Hypotensive in route with BP 50/35. CBG 138.

## 2017-01-20 DEATH — deceased

## 2017-02-19 NOTE — Discharge Summary (Signed)
  Death Summary  Barbara Ruiz ZOX:[id number] DOB: [birth year] DOA: 07-Feb-2017  PCP: Avon Gully, MD  Admit date: Feb 07, 2017 Date of Death: 2017/02/07 Time of Death: 1844/03/16 Notification: Dr. Rinaldo Ratel notified of death of 2017/02/07   History of present illness:  Barbara Ruiz is a 71 y.o. female with a history of Alzheimer's Dementia, HTN and recent hospitalization for hypernatremia and dehydration was brought to [facility] ED by EMS after episode of diarrhea and being unresponsive at [facility].  Unfortunately, limited history was provided by facility.  Per patient's daughters, who are bedside, patient was recently hospitalized a few weeks ago and seemed to be improving.  She wasn't eating much but was discharged and, to their knowledge, was still eating and taking her medication.  One of her daughters received a call today to state patient fell out of bed and had an episode of diarrhea.  No other history obtained.  Family asked for patient to be admitted under hospice services/ comfort care.  [facility] was asked to admit as transfer to hospice was not possible as patient was critically ill.  She was admitted with comfort based measures only.  Ronn Melena Treichler did not improve and she expired a few hours after admission.  Final Diagnoses:  1.   Lactic Acidosis 2. Sepsis 3. Hypernatremia 4. Dehydration   The results of significant diagnostics from this hospitalization (including imaging, microbiology, ancillary and laboratory) are listed below for reference.    Significant Diagnostic Studies: Dg Chest Port 1 View  Result Date: Feb 07, 2017 CLINICAL DATA:  Altered mental status EXAM: PORTABLE CHEST 1 VIEW COMPARISON:  December 19, 2016 FINDINGS: No edema or consolidation. Heart size and pulmonary vascularity are normal. No adenopathy. There are surgical clips in the left peritracheal region. No pneumothorax. IMPRESSION: No edema or consolidation. Electronically Signed   By: Bretta Bang III M.D.    On: February 07, 2017 12:58    Microbiology: No results found for this or any previous visit (from the past 240 hour(s)).   Labs: Basic Metabolic Panel: No results for input(s): NA, K, CL, CO2, GLUCOSE, BUN, CREATININE, CALCIUM, MG, PHOS in the last 168 hours. Liver Function Tests: No results for input(s): AST, ALT, ALKPHOS, BILITOT, PROT, ALBUMIN in the last 168 hours. No results for input(s): LIPASE, AMYLASE in the last 168 hours. No results for input(s): AMMONIA in the last 168 hours. CBC: No results for input(s): WBC, NEUTROABS, HGB, HCT, MCV, PLT in the last 168 hours. Cardiac Enzymes: No results for input(s): CKTOTAL, CKMB, CKMBINDEX, TROPONINI in the last 168 hours. D-Dimer No results for input(s): DDIMER in the last 72 hours. BNP: Invalid input(s): POCBNP CBG: No results for input(s): GLUCAP in the last 168 hours. Anemia work up No results for input(s): VITAMINB12, FOLATE, FERRITIN, TIBC, IRON, RETICCTPCT in the last 72 hours. Urinalysis    Component Value Date/Time   COLORURINE AMBER (A) 12/24/2016 1604   APPEARANCEUR HAZY (A) 12/24/2016 1604   LABSPEC 1.026 12/24/2016 1604   PHURINE 5.0 12/24/2016 1604   GLUCOSEU NEGATIVE 12/24/2016 1604   HGBUR NEGATIVE 12/24/2016 1604   BILIRUBINUR NEGATIVE 12/24/2016 1604   KETONESUR 20 (A) 12/24/2016 1604   PROTEINUR 100 (A) 12/24/2016 1604   NITRITE NEGATIVE 12/24/2016 1604   LEUKOCYTESUR NEGATIVE 12/24/2016 1604   Sepsis Labs Invalid input(s): PROCALCITONIN,  WBC,  LACTICIDVEN     SIGNED:  Katrinka Blazing, MD  [facility] 02/05/2017, 11:52 AM Pager [phone]  If 7PM-7AM, please contact night-coverage [email or web address] Password [email or web address]

## 2017-04-18 IMAGING — CT CT HEAD W/O CM
3 series · 16 of 47 positions shown, 19 images · non-contrast
Comparison: CT brain scan of 04/02/2016

CLINICAL DATA: Altered mental status, unresponsiveness, history of
dementia

EXAM:
CT HEAD WITHOUT CONTRAST
TECHNIQUE: Contiguous axial images were obtained from the base of the skull
through the vertex without intravenous contrast.

[Series 2: head trauma wo · axial · 0.41mm/px · z∈[+43,+168]mm · 10 of 30 slices shown, 13 images]
[im 3/30  brain]
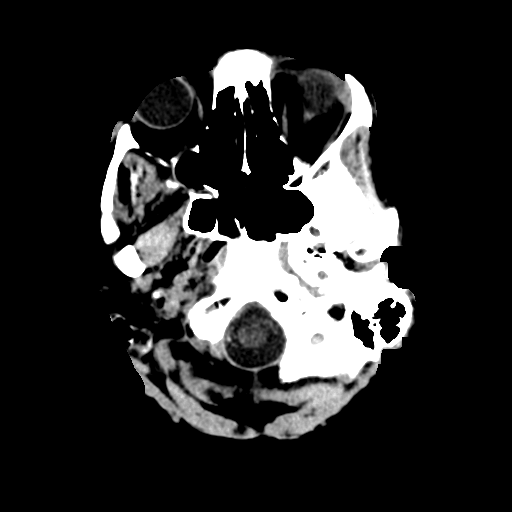
[im 3/30  bone]
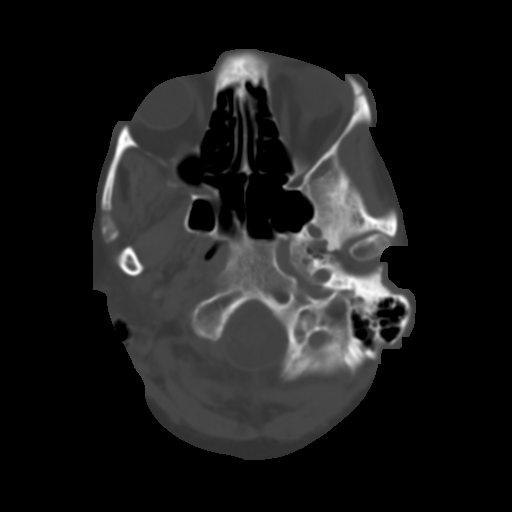
[im 6/30  brain]
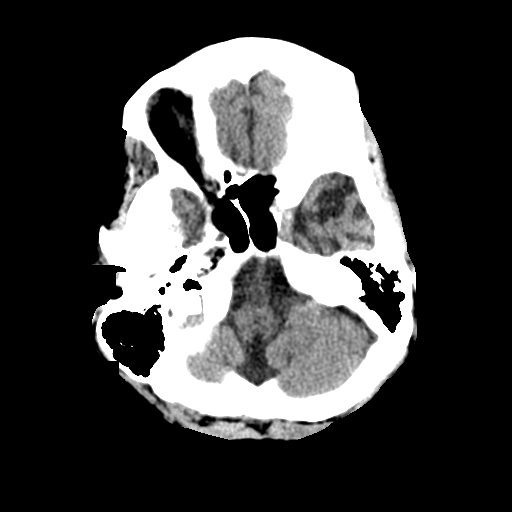
[im 9/30  brain]
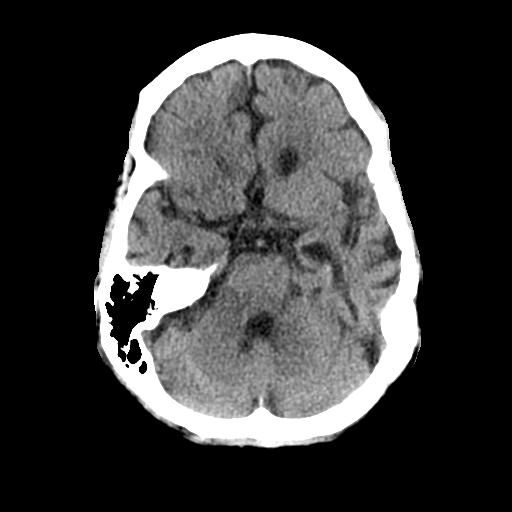
[im 11/30  brain]
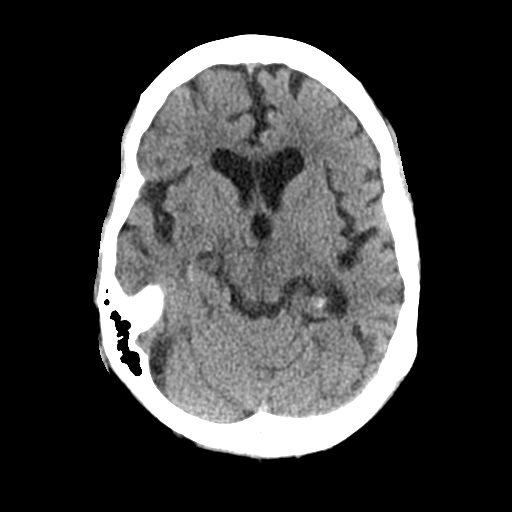
[im 14/30  brain]
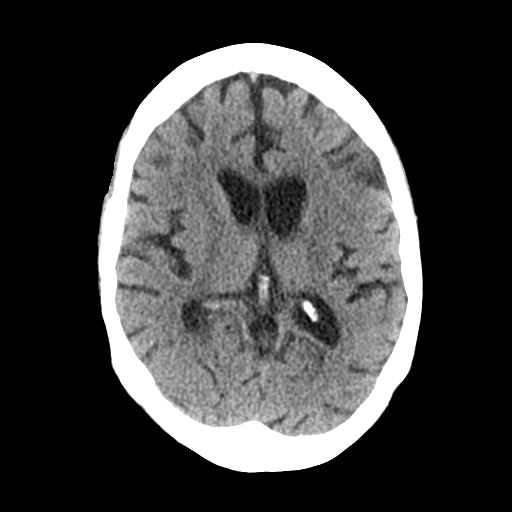
[im 14/30  bone]
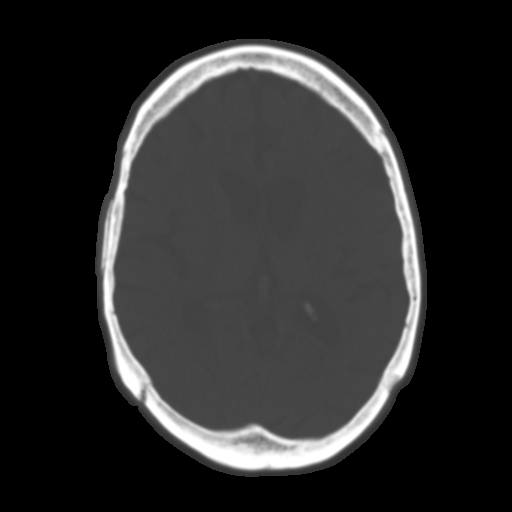
[im 17/30  brain]
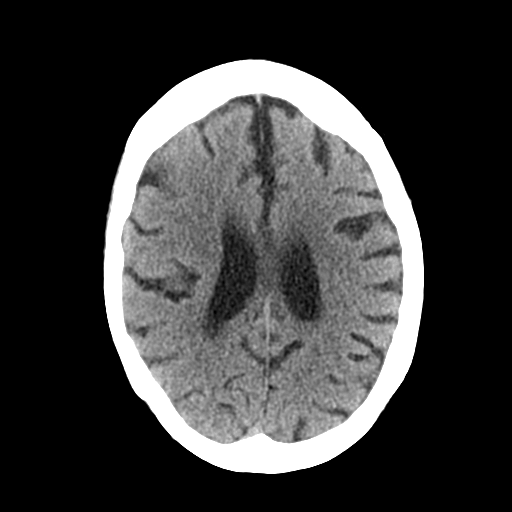
[im 20/30  brain]
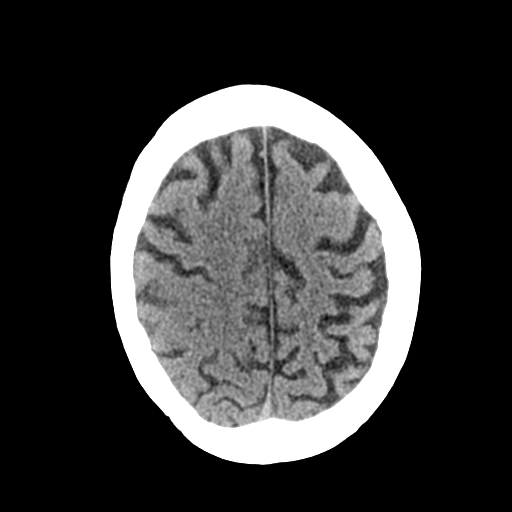
[im 23/30  brain]
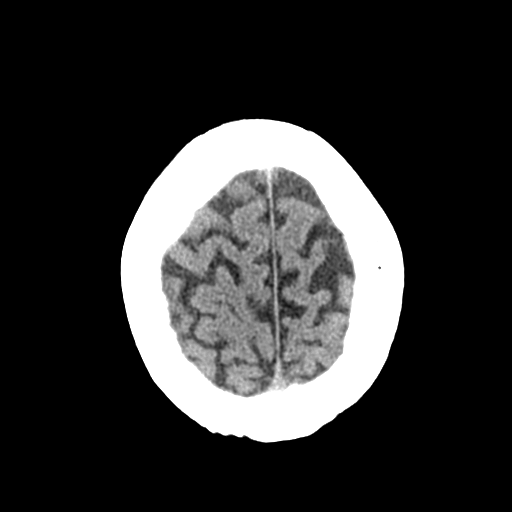
[im 25/30  brain]
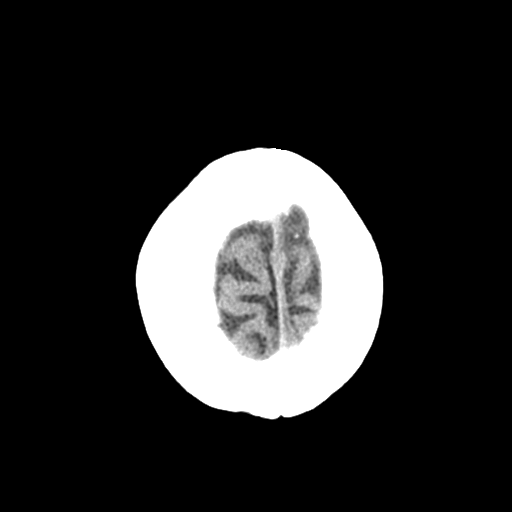
[im 25/30  bone]
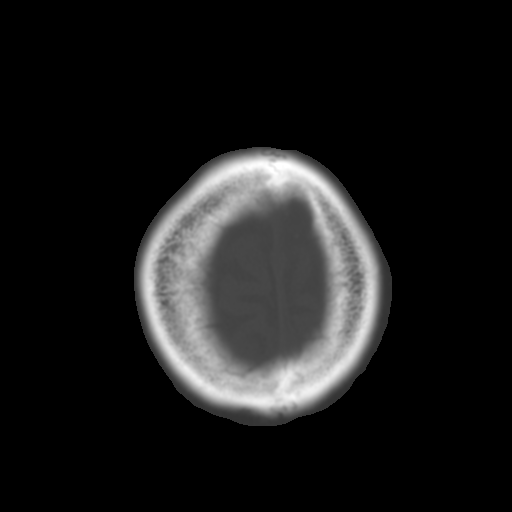
[im 28/30  brain]
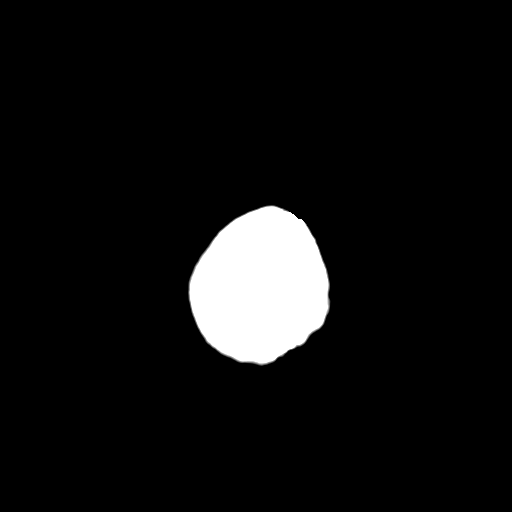

[Series 4: coronal soft tissue · coronal · 0.30mm/px · 3 of 64 slices shown]
[im 22/64  brain]
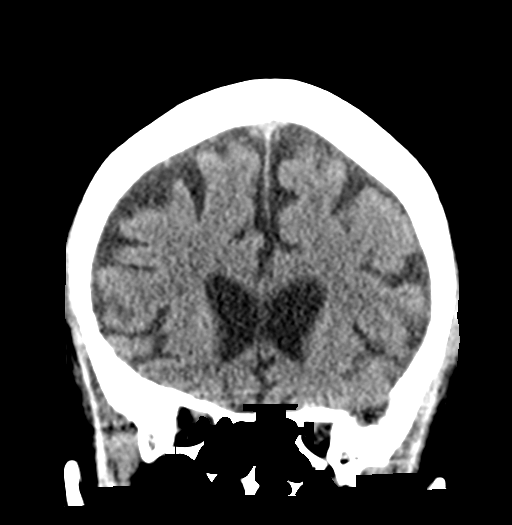
[im 29/64  brain]
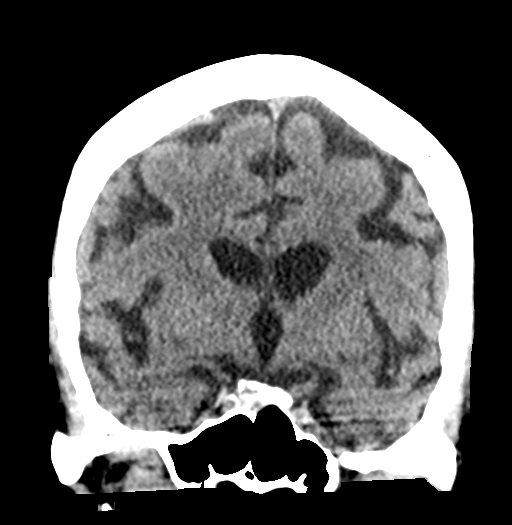
[im 36/64  brain]
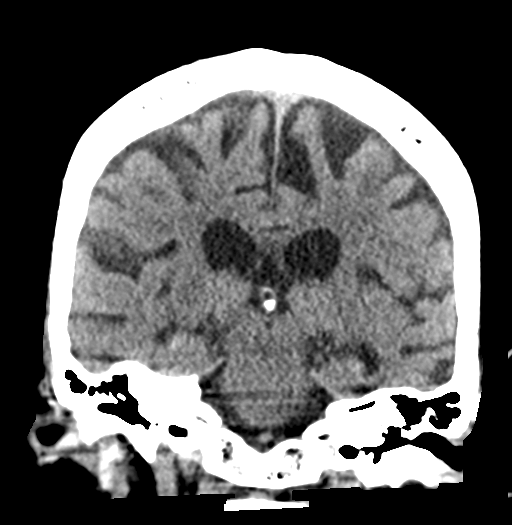

[Series 5: sagittal soft tissue · sagittal · 0.31mm/px · 3 of 51 slices shown]
[im 17/51  brain]
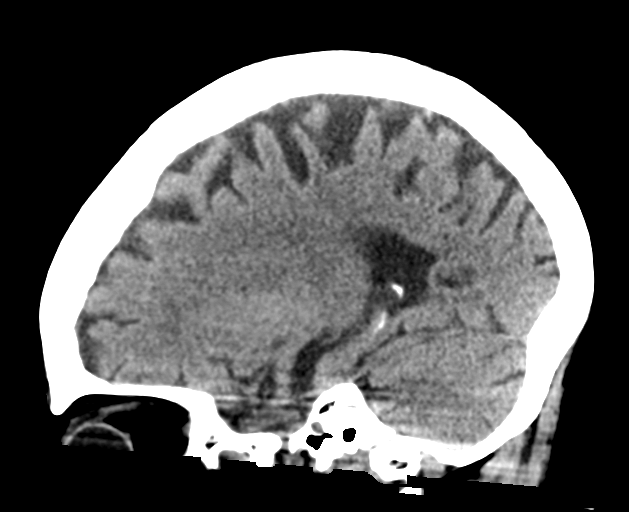
[im 26/51  brain]
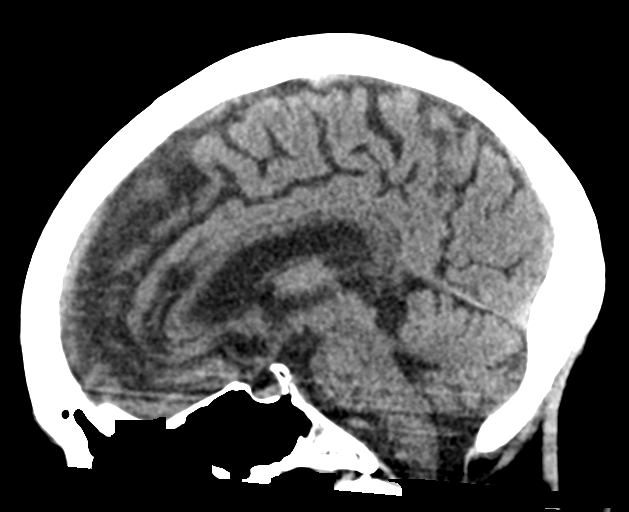
[im 34/51  brain]
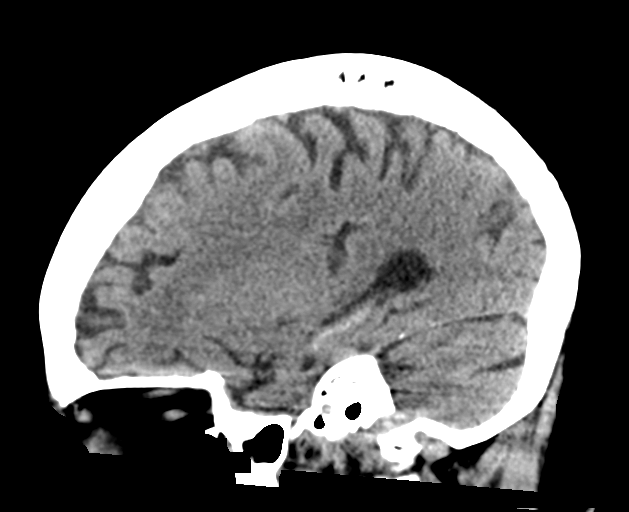

[16 of 47 positions shown; findings below may reference images not displayed]

FINDINGS: Brain: The ventricular system is slightly more prominent, and the
cortical sulci are prominent indicative of progressive diffuse
atrophy. The fourth ventricle and basilar cisterns are unremarkable.
The septum is midline in position. Minimal small vessel ischemic
changes noted in the periventricular white matter. However no
hemorrhage, mass lesion, or acute infarction is seen.

Vascular: No vascular abnormality is seen on this unenhanced study.

Skull: No calvarial abnormality is seen.

Sinuses/Orbits: The paranasal sinuses appear well pneumatized.

Other: None.
IMPRESSION: Atrophy and minimal small vessel ischemic change. No acute
intracranial abnormality.

## 2017-05-12 IMAGING — CR DG CHEST 1V PORT
1 series · 1 of 1 positions shown · non-contrast
Comparison: December 19, 2016

CLINICAL DATA: Altered mental status

EXAM:
PORTABLE CHEST 1 VIEW

[portable]
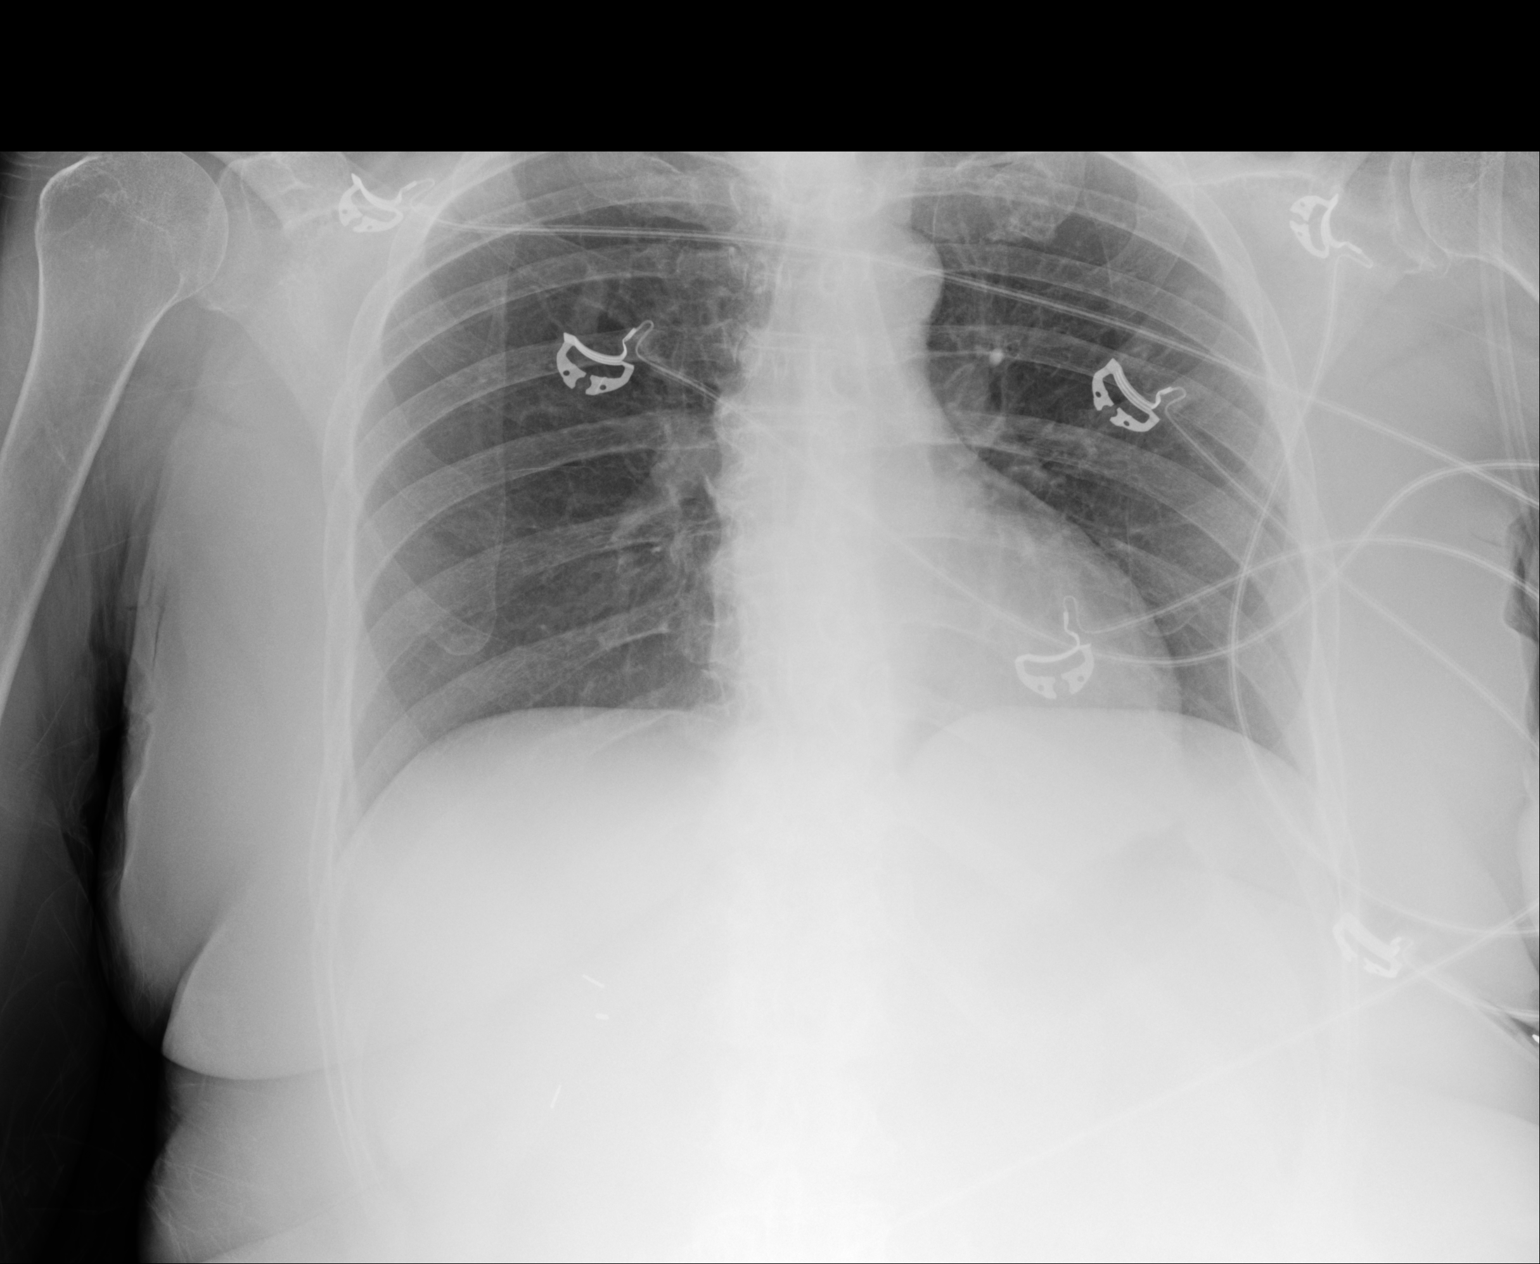

[1 of 1 positions shown; findings below may reference images not displayed]

FINDINGS: No edema or consolidation. Heart size and pulmonary vascularity are
normal. No adenopathy. There are surgical clips in the left
peritracheal region. No pneumothorax.
IMPRESSION: No edema or consolidation.
